# Patient Record
Sex: Female | Born: 1960 | Race: White | Hispanic: No | Marital: Married
Health system: Midwestern US, Community
[De-identification: ages and names within clinical notes are randomized; demographics above are authoritative.]

## PROBLEM LIST (undated history)

## (undated) DIAGNOSIS — H938X1 Other specified disorders of right ear: Secondary | ICD-10-CM

## (undated) DIAGNOSIS — K219 Gastro-esophageal reflux disease without esophagitis: Secondary | ICD-10-CM

## (undated) DIAGNOSIS — R1013 Epigastric pain: Secondary | ICD-10-CM

## (undated) DIAGNOSIS — S82899A Other fracture of unspecified lower leg, initial encounter for closed fracture: Secondary | ICD-10-CM

## (undated) DIAGNOSIS — M722 Plantar fascial fibromatosis: Secondary | ICD-10-CM

## (undated) DIAGNOSIS — K649 Unspecified hemorrhoids: Secondary | ICD-10-CM

## (undated) DIAGNOSIS — M8580 Other specified disorders of bone density and structure, unspecified site: Secondary | ICD-10-CM

## (undated) DIAGNOSIS — R55 Syncope and collapse: Secondary | ICD-10-CM

## (undated) DIAGNOSIS — D126 Benign neoplasm of colon, unspecified: Secondary | ICD-10-CM

## (undated) DIAGNOSIS — R1011 Right upper quadrant pain: Secondary | ICD-10-CM

## (undated) DIAGNOSIS — H04129 Dry eye syndrome of unspecified lacrimal gland: Secondary | ICD-10-CM

## (undated) DIAGNOSIS — T7840XA Allergy, unspecified, initial encounter: Secondary | ICD-10-CM

## (undated) HISTORY — DX: Allergy, unspecified, initial encounter: T78.40XA

## (undated) HISTORY — PX: TUBAL LIGATION: SHX77

## (undated) HISTORY — DX: Epigastric pain: R10.13

## (undated) HISTORY — DX: Other fracture of unspecified lower leg, initial encounter for closed fracture: S82.899A

## (undated) HISTORY — DX: Unspecified hemorrhoids: K64.9

## (undated) HISTORY — DX: Benign neoplasm of colon, unspecified: D12.6

## (undated) HISTORY — DX: Other specified disorders of right ear: H93.8X1

## (undated) HISTORY — DX: Other specified disorders of bone density and structure, unspecified site: M85.80

## (undated) HISTORY — DX: Dry eye syndrome of unspecified lacrimal gland: H04.129

## (undated) HISTORY — PX: POLYPECTOMY: SHX149

## (undated) HISTORY — DX: Plantar fascial fibromatosis: M72.2

## (undated) HISTORY — PX: UPPER GASTROINTESTINAL ENDOSCOPY: SHX188

## (undated) HISTORY — DX: Syncope and collapse: R55

## (undated) HISTORY — PX: COLONOSCOPY: SHX174

## (undated) HISTORY — DX: Gastro-esophageal reflux disease without esophagitis: K21.9

## (undated) HISTORY — DX: Right upper quadrant pain: R10.11

## (undated) HISTORY — PX: DENTAL EXAMINATION UNDER ANESTHESIA: SHX1447

---

## 1998-09-19 ENCOUNTER — Other Ambulatory Visit: Admission: RE | Admit: 1998-09-19 | Discharge: 1998-09-19 | Payer: Self-pay | Admitting: *Deleted

## 1999-10-03 ENCOUNTER — Other Ambulatory Visit: Admission: RE | Admit: 1999-10-03 | Discharge: 1999-10-03 | Payer: Self-pay | Admitting: *Deleted

## 2001-08-13 ENCOUNTER — Encounter: Admission: RE | Admit: 2001-08-13 | Discharge: 2001-08-13 | Payer: Self-pay | Admitting: *Deleted

## 2001-08-13 ENCOUNTER — Encounter: Payer: Self-pay | Admitting: *Deleted

## 2002-03-11 ENCOUNTER — Encounter: Payer: Self-pay | Admitting: *Deleted

## 2002-03-11 ENCOUNTER — Encounter: Admission: RE | Admit: 2002-03-11 | Discharge: 2002-03-11 | Payer: Self-pay | Admitting: *Deleted

## 2002-08-03 ENCOUNTER — Other Ambulatory Visit: Admission: RE | Admit: 2002-08-03 | Discharge: 2002-08-03 | Payer: Self-pay | Admitting: *Deleted

## 2003-07-18 ENCOUNTER — Other Ambulatory Visit: Admission: RE | Admit: 2003-07-18 | Discharge: 2003-07-18 | Payer: Self-pay | Admitting: *Deleted

## 2004-09-04 ENCOUNTER — Other Ambulatory Visit: Admission: RE | Admit: 2004-09-04 | Discharge: 2004-09-04 | Payer: Self-pay | Admitting: Obstetrics and Gynecology

## 2004-12-16 ENCOUNTER — Ambulatory Visit (HOSPITAL_COMMUNITY): Admission: RE | Admit: 2004-12-16 | Discharge: 2004-12-16 | Payer: Self-pay | Admitting: Otolaryngology

## 2005-10-01 ENCOUNTER — Other Ambulatory Visit: Admission: RE | Admit: 2005-10-01 | Discharge: 2005-10-01 | Payer: Self-pay | Admitting: Obstetrics and Gynecology

## 2005-12-01 DIAGNOSIS — D126 Benign neoplasm of colon, unspecified: Secondary | ICD-10-CM

## 2005-12-01 HISTORY — DX: Benign neoplasm of colon, unspecified: D12.6

## 2005-12-05 ENCOUNTER — Ambulatory Visit: Payer: Self-pay | Admitting: Gastroenterology

## 2005-12-17 ENCOUNTER — Ambulatory Visit: Payer: Self-pay | Admitting: Gastroenterology

## 2005-12-17 ENCOUNTER — Encounter (INDEPENDENT_AMBULATORY_CARE_PROVIDER_SITE_OTHER): Payer: Self-pay | Admitting: Specialist

## 2005-12-17 DIAGNOSIS — D126 Benign neoplasm of colon, unspecified: Secondary | ICD-10-CM | POA: Insufficient documentation

## 2005-12-17 DIAGNOSIS — K644 Residual hemorrhoidal skin tags: Secondary | ICD-10-CM | POA: Insufficient documentation

## 2005-12-17 HISTORY — DX: Benign neoplasm of colon, unspecified: D12.6

## 2008-05-01 ENCOUNTER — Ambulatory Visit: Payer: Self-pay | Admitting: Gastroenterology

## 2008-05-01 DIAGNOSIS — R1011 Right upper quadrant pain: Secondary | ICD-10-CM

## 2008-05-01 DIAGNOSIS — K219 Gastro-esophageal reflux disease without esophagitis: Secondary | ICD-10-CM

## 2008-05-01 HISTORY — DX: Gastro-esophageal reflux disease without esophagitis: K21.9

## 2008-05-01 HISTORY — DX: Right upper quadrant pain: R10.11

## 2008-06-20 ENCOUNTER — Ambulatory Visit: Payer: Self-pay | Admitting: Gastroenterology

## 2008-06-22 ENCOUNTER — Encounter: Payer: Self-pay | Admitting: Gastroenterology

## 2008-07-04 ENCOUNTER — Ambulatory Visit: Payer: Self-pay | Admitting: Gastroenterology

## 2008-07-04 ENCOUNTER — Encounter: Payer: Self-pay | Admitting: Gastroenterology

## 2008-07-06 ENCOUNTER — Encounter: Payer: Self-pay | Admitting: Gastroenterology

## 2008-07-10 ENCOUNTER — Telehealth: Payer: Self-pay | Admitting: Gastroenterology

## 2008-08-11 ENCOUNTER — Telehealth: Payer: Self-pay | Admitting: Gastroenterology

## 2009-01-26 ENCOUNTER — Encounter: Payer: Self-pay | Admitting: Gastroenterology

## 2009-12-19 ENCOUNTER — Telehealth: Payer: Self-pay | Admitting: Gastroenterology

## 2010-01-15 ENCOUNTER — Ambulatory Visit: Payer: Self-pay | Admitting: Gastroenterology

## 2010-01-15 DIAGNOSIS — Z860100 Personal history of colon polyps, unspecified: Secondary | ICD-10-CM | POA: Insufficient documentation

## 2010-01-15 DIAGNOSIS — R1013 Epigastric pain: Secondary | ICD-10-CM

## 2010-01-15 HISTORY — DX: Epigastric pain: R10.13

## 2010-01-17 ENCOUNTER — Ambulatory Visit (HOSPITAL_COMMUNITY): Admission: RE | Admit: 2010-01-17 | Discharge: 2010-01-17 | Payer: Self-pay | Admitting: Gastroenterology

## 2010-01-18 ENCOUNTER — Telehealth: Payer: Self-pay | Admitting: Gastroenterology

## 2010-01-18 DIAGNOSIS — R933 Abnormal findings on diagnostic imaging of other parts of digestive tract: Secondary | ICD-10-CM | POA: Insufficient documentation

## 2010-01-22 ENCOUNTER — Encounter: Admission: RE | Admit: 2010-01-22 | Discharge: 2010-01-22 | Payer: Self-pay | Admitting: Gastroenterology

## 2010-03-11 ENCOUNTER — Ambulatory Visit: Payer: Self-pay | Admitting: Gastroenterology

## 2010-04-15 ENCOUNTER — Encounter: Payer: Self-pay | Admitting: Gastroenterology

## 2010-05-06 ENCOUNTER — Encounter: Admission: RE | Admit: 2010-05-06 | Discharge: 2010-08-04 | Payer: Self-pay | Admitting: Otolaryngology

## 2010-05-29 ENCOUNTER — Telehealth: Payer: Self-pay | Admitting: Gastroenterology

## 2010-09-24 ENCOUNTER — Telehealth: Payer: Self-pay | Admitting: Gastroenterology

## 2010-10-01 ENCOUNTER — Telehealth: Payer: Self-pay | Admitting: Gastroenterology

## 2010-12-11 ENCOUNTER — Encounter: Payer: Self-pay | Admitting: Gastroenterology

## 2010-12-20 ENCOUNTER — Encounter
Admission: RE | Admit: 2010-12-20 | Discharge: 2010-12-20 | Payer: Self-pay | Source: Home / Self Care | Attending: Family Medicine | Admitting: Family Medicine

## 2010-12-21 ENCOUNTER — Encounter: Payer: Self-pay | Admitting: Otolaryngology

## 2010-12-31 NOTE — Progress Notes (Signed)
Summary: Throat feels dry  Phone Note Call from Patient Call back at [phone]   Call For: DR Russella Dar Summary of Call: Throat feels really dry and is unsure if she should see GI or ENT? Initial call taken by: Leanor Kail [facility],  September 24, 2010 12:35 PM  Follow-up for Phone Call        Left message for patient to call back Darcey Nora RN, [facility]  September 24, 2010 1:16 PM  Patient c/o dry throat wondering if this is from GERD.  She currently is on Dexilant. She states that her symptoms are better after she eats or drinks.    She is wondering if she should see a GI or ENT for this problem.  Please advise Follow-up by: Darcey Nora RN, CGRN,  September 24, 2010 2:46 PM  Additional Follow-up for Phone Call Additional follow up Details #1::        Dry throat is usually not GERD or GI related. Would see ENT first. Additional Follow-up by: Meryl Dare MD FACG,  September 24, 2010 3:04 PM    Additional Follow-up for Phone Call Additional follow up Details #2::    patient advised of Dr Ardell Isaacs recommendations Follow-up by: Darcey Nora RN, CGRN,  September 24, 2010 3:22 PM

## 2010-12-31 NOTE — Progress Notes (Signed)
Summary: MRI resch  Phone Note Call from Patient Call back at [phone]   Caller: Patient Call For: Dr. Russella Dar Reason for Call: Talk to Nurse Summary of Call: pt wants to know is it is ok that she moved her MRI from Friday (2/25) to Tuesday (2/22) at 7:30AM... pt said that she only needs a call back if this will be a problem Initial call taken by: Vallarie Mare,  January 18, 2010 4:30 PM

## 2010-12-31 NOTE — Assessment & Plan Note (Signed)
Summary: F/U APPT...LSW.   History of Present Illness Visit Type: Follow-up Visit Primary GI MD: Elie Goody MD [facility] Primary Provider: Maurice Small, MD Requesting Provider: n/a Chief Complaint: Patient comes for f/u reflux. She states that she has gotten somewhat better but still experiences some belching and food regurgitation. She denies any abdominal pain or other GI problems at this time. History of Present Illness:   This is a return office visit for GERD with LPR and recurrent epigastric pain. Her epigastric pain has completely resolved. Her reflux symptoms have improved substantially on Dexilant. Her voice fatigue is better, but persists.   GI Review of Systems    Reports acid reflux, belching, and  heartburn.      Denies abdominal pain, bloating, chest pain, dysphagia with liquids, dysphagia with solids, loss of appetite, nausea, vomiting, vomiting blood, weight loss, and  weight gain.      Reports hemorrhoids.     Denies anal fissure, black tarry stools, change in bowel habit, constipation, diarrhea, diverticulosis, fecal incontinence, heme positive stool, irritable bowel syndrome, jaundice, light color stool, liver problems, rectal bleeding, and  rectal pain. Preventive Screening-Counseling & Management  Alcohol-Tobacco     Smoking Status: quit   Current Medications (verified): 1)  Combipatch 0.05-0.14 Mg/day Pttw (Estradiol-Norethindrone Acet) .... Change Patch Twice Weekly 2)  Allegra 180 Mg Tabs (Fexofenadine Hcl) .... Take 1 Tablet By Mouth Once A Day 3)  Align  Caps (Probiotic Product) .... One Capsule By Mouth Once Daily 4)  Dexilant 60 Mg Cpdr (Dexlansoprazole) .... One Tablet By Mouth Once Daily  Allergies (verified): No Known Drug Allergies  Past History:  Past Medical History: HEMORRHOIDS, EXTERNAL (ICD-455.3) ADENOMATOUS COLONIC POLYP (ICD-211.3) GERD, LPR  Past Surgical History: Reviewed history from 04/28/2008 and no changes required. Tubal  Ligation  Family History: Family History of Colon Cancer:Father Family History of Heart Disease: Mother Family History of Colon Polyps: Brother  Social History: Married Alcohol Use - yes rare wine on weekend Illicit Drug Use - no Patient gets regular exercise. Patient is a former smoker. -smoked occasionally in college Smoking Status:  quit  Review of Systems       The pertinent positives and negatives are noted as above and in the HPI. All other ROS were reviewed and were negative.   Vital Signs:  Patient profile:   50 year old female Height:      67 inches Weight:      132.50 pounds BMI:     20.83 BSA:     1.70 Pulse rate:   68 / minute Pulse rhythm:   regular BP sitting:   104 / 62  (right arm)  Vitals Entered By: Hortense Ramal CMA Duncan Dull) (March 11, 2010 8:46 AM)  Physical Exam  General:  Well developed, well nourished, no acute distress. Head:  Normocephalic and atraumatic. Eyes:  PERRLA, no icterus. Ears:  Normal auditory acuity. Mouth:  No deformity or lesions, dentition normal. Lungs:  Clear throughout to auscultation. Heart:  Regular rate and rhythm; no murmurs, rubs,  or bruits. Abdomen:  Soft, nontender and nondistended. No masses, hepatosplenomegaly or hernias noted. Normal bowel sounds. Psych:  Alert and cooperative. Normal mood and affect.  Impression & Recommendations:  Problem # 1:  GERD (ICD-530.81) Assessment Improved GERD with LPR improved. Intensify antireflux measures. Continue current medication. If her vocal symptoms do not improve, return to ENT for further evaluation.  Problem # 2:  ABDOMINAL PAIN-EPIGASTRIC (ICD-789.06) Assessment: Improved Resolved.  Problem # 3:  GASTROINTESTINAL XRAY, ABNORMAL (ICD-793.4) Small hepatic hemangiomas on MR imaging. No further followup needed.  Problem # 4:  PERSONAL HX COLONIC POLYPS (ICD-V12.72) Personal history of adenomatous colon polyps. Recall colonoscopy to January 2012  Problem # 5:   COLORECTAL CANCER, FAMILY HX (ICD-V16.0) Father with colon cancer. See problem number 4.  Patient Instructions: 1)  Avoid foods high in acid content ( tomatoes, citrus juices, spicy foods) . Avoid eating within 3 to 4 hours of lying down or before exercising. Do not over eat; try smaller more frequent meals. Elevate head of bed four inches when sleeping.  2)  Please schedule a follow-up appointment in 6 months. 3)  Copy sent to : Maurice Small, MD 4)  The medication list was reviewed and reconciled.  All changed / newly prescribed medications were explained.  A complete medication list was provided to the patient / caregiver.

## 2010-12-31 NOTE — Progress Notes (Signed)
Summary: Triage  Phone Note Call from Patient Call back at [phone]   Caller: Patient Call For: Dr. Russella Dar Reason for Call: Talk to Nurse Summary of Call: pt. stopped the dexilant b/c it was causing her throat to be dry....could he recommend another med. Initial call taken by: Karna Christmas,  October 01, 2010 12:43 PM  Follow-up for Phone Call        Left message for patient to call back Darcey Nora RN, [facility]  October 01, 2010 1:37 PM  patient wanted Dr Russella Dar to be aware she stopped the Dexilant last week and she is not having any further dry throat.  She is wondering if she needs to try an alternate PPI. Darcey Nora RN, [facility]  October 01, 2010 1:49 PM  Additional Follow-up for Phone Call Additional follow up Details #1::        Try omeprazole 40mg  by mouth qam Additional Follow-up by: Meryl Dare MD Nani Skillern 2:05 PM    Additional Follow-up for Phone Call Additional follow up Details #2::    patient advised of Dr Ardell Isaacs recommendations Darcey Nora RN, [facility]  October 01, 2010 2:32 PM Follow-up by: Darcey Nora RN, CGRN,  October 01, 2010 2:32 PM  New/Updated Medications: OMEPRAZOLE 40 MG CPDR (OMEPRAZOLE) 1 by mouth once daily Prescriptions: OMEPRAZOLE 40 MG CPDR (OMEPRAZOLE) 1 by mouth once daily  #30 x 11   Entered by:   Darcey Nora RN, CGRN   Authorized by:   Meryl Dare MD [facility]   Signed by:   Darcey Nora RN, CGRN on 10/01/2010   Method used:   Electronically to        [facility]* (retail)       [address]       [place], [place]  [id number]       Ph: [phone] or [phone]       Fax: [phone]   RxID:   [id number]

## 2010-12-31 NOTE — Progress Notes (Signed)
Summary: triage  Phone Note Call from Patient Call back at [phone]   Caller: Patient Call For: Russella Dar Reason for Call: Talk to Nurse Summary of Call: Patient states that she has muscle spasm and reflux wants to know what to do Initial call taken by: Tawni Levy,  December 19, 2009 2:03 PM  Follow-up for Phone Call        Left message for patient to call back Darcey Nora RN, [facility]  December 19, 2009 3:0   Patient  c/o epigastric discomfort and reflux she had stopped her PPI.  I have advied ehr to restart on zegerid.  She reports she did purchase some Priolosec and started on it yesterday.  Patient will try 2 otc prevacid a day and call back for rev if no improvement after 2 weeks. Follow-up by: Darcey Nora RN, CGRN,  December 19, 2009 3:27 PM

## 2010-12-31 NOTE — Progress Notes (Signed)
Summary: Reduce Dexilant  Phone Note Call from Patient Call back at [phone]   Call For: Dr Russella Dar Reason for Call: Talk to Doctor Summary of Call: Wants to reduce reflux medicine Dexilant or maybe switch to something over the counter. Initial call taken by: Leanor Kail [facility],  May 29, 2010 1:03 PM  Follow-up for Phone Call        Called pt and phone rang with no voicemial to leave a message.  Follow-up by: Christie Nottingham CMA Duncan Dull),  May 29, 2010 1:55 PM  Additional Follow-up for Phone Call Additional follow up Details #1::        left message for pt  to call back  Additional Follow-up by: Christie Nottingham CMA Duncan Dull),  May 30, 2010 3:05 PM    Additional Follow-up for Phone Call Additional follow up Details #2::    Pt states her ENT physician told her after her laryngoscopy that there was no reflux shown and that they do not understand why she is on medicine for reflux. I asked pt is she having reflux symptoms while taking Dexilant one tablet by mouth once daily and pt states her symptoms have greatly improved. She wants to reduce the milligram or switch to a PPI OTC b/c she feels like long term use of a PPI will hurt her bones. She states she just wants to try to take something less expensive and ideally not be on any medicine for reflux. I told her to try to take atleast a PPI OTC once a day and to intensify anti-reflux measures. Told pt that we do not recommend her just stopping a PPI b/c she had reflux symptoms still when she was seen last in the office. I told her try Prevacid OTC one tablet by mouth once daily x 2 weeks. If symptoms are not under control then increase to two times a day x 2 weeks. During the whole time staying on anti-reflux measeures. After that if her symptoms are not resolved we recommend to start back on Dexilant one tablet by mouth due to her symptoms being well controlled on this medication  Pt agreed and verblaized understanding.  Pt aware that note will be sent  to doctor to make that this ok with him.  Follow-up by: Christie Nottingham CMA Duncan Dull),  May 31, 2010 1:03 PM  Additional Follow-up for Phone Call Additional follow up Details #3:: Details for Additional Follow-up Action Taken: Agree with above Additional Follow-up by: Meryl Dare MD Clementeen Graham,  June 03, 2010 3:50 PM

## 2010-12-31 NOTE — Letter (Signed)
Summary: [facility]  [facility]   Imported By: Lester [place] 04/26/2010 08:24:58  _____________________________________________________________________  External Attachment:    Type:   Image     Comment:   External Document

## 2010-12-31 NOTE — Assessment & Plan Note (Signed)
Summary: stomach spasms--ch.   History of Present Illness Visit Type: Follow-up Visit Primary GI MD: Elie Goody MD [facility] Primary Provider: Maurice Small, MD Requesting Provider: n/a Chief Complaint: stomach spasm, also reflux and belching esp after eating "rich" food History of Present Illness:   Virginia Liu relates worsening problems with acid reflux and belching for the past 2 months. She has been off all acid medications for several months due to concerns about these medications causing a low vitamin D level. She relates voice fatigue as well.  She also relates intermittent problems with gas, bloating, and "upper abdominal spasms" that may occur several times a day in the last for several minutes.   GI Review of Systems    Reports abdominal pain, acid reflux, and  belching.     Location of  Abdominal pain: upper abdomen.    Denies bloating, chest pain, dysphagia with liquids, dysphagia with solids, heartburn, loss of appetite, nausea, vomiting, vomiting blood, weight loss, and  weight gain.        Denies anal fissure, black tarry stools, change in bowel habit, constipation, diarrhea, diverticulosis, fecal incontinence, heme positive stool, hemorrhoids, irritable bowel syndrome, jaundice, light color stool, liver problems, rectal bleeding, and  rectal pain. Current Medications (verified): 1)  Combipatch 0.05-0.14 Mg/day Pttw (Estradiol-Norethindrone Acet) .... Change Patch Twice Weekly 2)  Allegra 180 Mg Tabs (Fexofenadine Hcl) .... Take 1 Tablet By Mouth Once A Day  Allergies (verified): No Known Drug Allergies  Past History:  Past Medical History: Reviewed history from 05/01/2008 and no changes required. HEMORRHOIDS, EXTERNAL (ICD-455.3) ADENOMATOUS COLONIC POLYP (ICD-211.3)  Past Surgical History: Reviewed history from 04/28/2008 and no changes required. Tubal Ligation  Family History: Family History of Colon Cancer:Father Family History of Heart Disease:  Mother  Social History: Reviewed history from 05/01/2008 and no changes required. Married Patient has never smoked.  Alcohol Use - yes rare wine on weekend Illicit Drug Use - no Patient gets regular exercise.  Review of Systems       The pertinent positives and negatives are noted as above and in the HPI. All other ROS were reviewed and were negative.   Vital Signs:  Patient profile:   50 year old female Height:      67 inches Weight:      131 pounds BMI:     20.59 Pulse rate:   72 / minute Pulse rhythm:   regular BP sitting:   100 / 60  (left arm) Cuff size:   regular  Vitals Entered By: Francee Piccolo CMA Duncan Dull) (January 15, 2010 8:44 AM)  Physical Exam  General:  Well developed, well nourished, no acute distress. Head:  Normocephalic and atraumatic. Eyes:  PERRLA, no icterus. Mouth:  No deformity or lesions, dentition normal. Lungs:  Clear throughout to auscultation. Heart:  Regular rate and rhythm; no murmurs, rubs,  or bruits. Abdomen:  Soft, nontender and nondistended. No masses, hepatosplenomegaly or hernias noted. Normal bowel sounds. Psych:  Alert and cooperative. Normal mood and affect.  Impression & Recommendations:  Problem # 1:  GERD (ICD-530.81)  Recurrent reflux with LPR symptoms. Begin Dexilant 60 mg q.a.m. Continue all standard antireflux measures. She is advised that optimal management for this problem would include remaining on a proton pump inhibitor long term. She is advised to have bone density studies on a regular basis by her PCP.  Orders: Ultrasound Abdomen (UAS)  Problem # 2:  ABDOMINAL PAIN-EPIGASTRIC (ICD-789.06)  Epigastric "spasms"  associated with gas and bloating. Begin  a low-fat gas diet and Align daily. If her symptoms don't respond, consider a trial of anti-spasmodic. Schedule abdominal ultrasound rule out cholelithiasis.  Orders: Ultrasound Abdomen (UAS)  Problem # 3:  COLORECTAL CANCER, FAMILY HX (ICD-V16.0) Colonoscopy  January 2012.  Problem # 4:  PERSONAL HX COLONIC POLYPS (ICD-V12.72) As in problem #3.  Patient Instructions: 1)  You have been scheduled for a abdominal ultrasound.  2)  Start Align one tablet by mouth once daily. 3)  Pick up prescription at your pharmacy.  4)  Excessive Gas Diet handout given.  5)  Please schedule a follow-up appointment in 2 months.  6)  Copy sent to : Maurice Small, MD 7)  The medication list was reviewed and reconciled.  All changed / newly prescribed medications were explained.  A complete medication list was provided to the patient / caregiver.  Prescriptions: DEXILANT 60 MG CPDR (DEXLANSOPRAZOLE) one tablet by mouth once daily  #30 x 11   Entered by:   Christie Nottingham CMA (AAMA)   Authorized by:   Meryl Dare MD [facility]   Signed by:   Meryl Dare MD [facility] on 01/15/2010   Method used:   Electronically to        [facility] (retail)       [address]       [place], [place]  [id number]       Ph: [phone] or [phone]       Fax: [phone]   RxID:   [id number]

## 2011-01-02 NOTE — Letter (Signed)
Summary: Colonoscopy Letter  [facility]  [address] [place], [place] [address]   Phone: [phone]  Fax: [phone]      December 11, 2010 MRN: [id number]   Virginia Liu [address] [place], [place]  [address]   Dear Ms. Virginia Liu,   According to your medical record, it is time for you to schedule a Colonoscopy. The American Cancer Society recommends this procedure as a method to detect early colon cancer. Patients with a family history of colon cancer, or a personal history of colon polyps or inflammatory bowel disease are at increased risk.  This letter has been generated based on the recommendations made at the time of your procedure. If you feel that in your particular situation this may no longer apply, please contact our office.  Please call our office at [phone] to schedule this appointment or to update your records at your earliest convenience.  Thank you for cooperating with [place] to provide you with the very best care possible.   Sincerely,  Judie Petit T. Russella Dar, M.D.  [facility] [phone]

## 2011-01-04 ENCOUNTER — Encounter: Payer: Self-pay | Admitting: Family Medicine

## 2011-05-30 ENCOUNTER — Telehealth: Payer: Self-pay | Admitting: Gastroenterology

## 2011-05-30 ENCOUNTER — Ambulatory Visit (AMBULATORY_SURGERY_CENTER): Payer: BC Managed Care – PPO | Admitting: *Deleted

## 2011-05-30 VITALS — Ht 67.5 in | Wt 128.0 lb

## 2011-05-30 DIAGNOSIS — Z86010 Personal history of colonic polyps: Secondary | ICD-10-CM

## 2011-05-30 MED ORDER — PEG-KCL-NACL-NASULF-NA ASC-C 100 G PO SOLR: ORAL | Status: DC

## 2011-05-30 NOTE — Telephone Encounter (Signed)
Asked patient if she switched back to Dexilant after we sent a prescription for omeprazole back in November of 2011. She states she picked up the prescription but never tried to take it and continued back on the Dexilant after several months of not taking anything. Pt states she might still have that prescription for omeprazole and would try to take that instead of the Dexilant but if she does not have it, she will call back to get a refill. I also told her she is due for a office visit and patient states she is scheduled to have a Colonoscopy in July. Told patient to just call back if she needs a refill.

## 2011-06-02 ENCOUNTER — Other Ambulatory Visit: Payer: Self-pay | Admitting: Gastroenterology

## 2011-06-10 ENCOUNTER — Encounter: Payer: Self-pay | Admitting: Gastroenterology

## 2011-06-10 ENCOUNTER — Ambulatory Visit (AMBULATORY_SURGERY_CENTER): Payer: BC Managed Care – PPO | Admitting: Gastroenterology

## 2011-06-10 DIAGNOSIS — D126 Benign neoplasm of colon, unspecified: Secondary | ICD-10-CM

## 2011-06-10 DIAGNOSIS — Z86010 Personal history of colonic polyps: Secondary | ICD-10-CM

## 2011-06-10 DIAGNOSIS — Z1211 Encounter for screening for malignant neoplasm of colon: Secondary | ICD-10-CM

## 2011-06-10 DIAGNOSIS — Z800 Family history of malignant neoplasm of digestive organs: Secondary | ICD-10-CM

## 2011-06-10 MED ORDER — SODIUM CHLORIDE 0.9 % IV SOLN: 500.0000 mL | INTRAVENOUS | Status: DC

## 2011-06-10 NOTE — Patient Instructions (Signed)
Discharge instructions given with verbal understanding. Handouts on polyps given. Resume previous medications. 

## 2011-06-11 ENCOUNTER — Telehealth: Payer: Self-pay

## 2011-06-11 NOTE — Telephone Encounter (Signed)
No ID on answering machine. 

## 2011-06-17 ENCOUNTER — Encounter: Payer: Self-pay | Admitting: Gastroenterology

## 2011-08-20 ENCOUNTER — Other Ambulatory Visit: Payer: Self-pay | Admitting: Gastroenterology

## 2011-12-24 ENCOUNTER — Ambulatory Visit (INDEPENDENT_AMBULATORY_CARE_PROVIDER_SITE_OTHER): Payer: BC Managed Care – PPO | Admitting: Sports Medicine

## 2011-12-24 VITALS — BP 120/70 | Ht 67.0 in | Wt 130.0 lb

## 2011-12-24 DIAGNOSIS — M722 Plantar fascial fibromatosis: Secondary | ICD-10-CM | POA: Insufficient documentation

## 2011-12-24 HISTORY — DX: Plantar fascial fibromatosis: M72.2

## 2011-12-24 MED ORDER — MELOXICAM 15 MG PO TABS: 15.0000 mg | ORAL_TABLET | Freq: Every day | ORAL | Status: DC | PRN

## 2011-12-24 NOTE — Progress Notes (Signed)
  Subjective:    Patient ID: Virginia Liu, female    DOB: 1961/11/05, 51 y.o.   MRN: [id number]  HPI  Pt presents to clinic for evaluation of lt heel pain that started 04/2011.  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 Wears soft orthotics made by [employer], but no longer helping heel pain. Pain worse in mornings and evenings.  Toes very sensitive at night, painful for bed sheets to touch toes.  Rides stationary bike, elliptical, stairmaster, body pump class. Enjoys jogging,but unable to do this now 2/2 heel pain.   Review of Systems     Objective:   Physical Exam  Lt long arch preserved Rt long arch mild pronation Mild splaying between 3rd and 4th toes bilat Drop of 3rd and 4th MT heads on lt Drop of 4th MT head, partially dropped 3rd MT head with abnormal callus Excellent great toe motion Nml AT bilat  [place] Left PF is 0.81 thick There is a small tear at the medial insertion with hypoechoic change  RT PF shows slt thickening at 0.53 There is a large spur       Assessment & Plan:

## 2011-12-24 NOTE — Patient Instructions (Signed)
Please try your orthotics with felt heel lifts, and wear the pair that is most comfortable  Do suggested exercises and stretches for plantar fasciits daily  Please do ice baths on left heel daily   Take meloxicam 15 mg - 1 tab daily for 2 weeks, then as needed   Please follow up in 1 month  Thank you for seeing [place] today!

## 2011-12-24 NOTE — Assessment & Plan Note (Signed)
Begin series of stretches and exercises  Icing  Has good orthotics so we added felt lifts of 2 thicknesses She can use the one feels best  Arch strap  Reck 1 mo

## 2012-01-21 ENCOUNTER — Ambulatory Visit: Payer: BC Managed Care – PPO | Admitting: Sports Medicine

## 2012-01-28 ENCOUNTER — Ambulatory Visit (INDEPENDENT_AMBULATORY_CARE_PROVIDER_SITE_OTHER): Payer: BC Managed Care – PPO | Admitting: Sports Medicine

## 2012-01-28 VITALS — BP 124/70

## 2012-01-28 DIAGNOSIS — M722 Plantar fascial fibromatosis: Secondary | ICD-10-CM

## 2012-01-28 NOTE — Assessment & Plan Note (Signed)
Chronic and very painful  Advised that we need to try another orthotic that is semi rigid  Patient was fitted for a : standard, cushioned, semi-rigid orthotic. The orthotic was heated and afterward the patient stood on the orthotic blank positioned on the orthotic stand. The patient was positioned in subtalar neutral position and 10 degrees of ankle dorsiflexion in a weight bearing stance. After completion of molding, a stable base was applied to the orthotic blank. The blank was ground to a stable position for weight bearing. Size: 7 red cambray Base: blue EVA Posting: foam rubber heel pads bilat Additional orthotic padding:  Feels these are too firm in arch However walking gait does look neutral Suggest trying to break these in with alternating with the softer old orthotic  Cont arch strap, exercises, stretches and icing  Reck after 2 months  I think her frustration level and difficulty with dealing with this pain (does not want to use meds) are challenges for getting her treatment to work  If simply not better - consider CSI, MRI for further DX or lithotripsy

## 2012-01-28 NOTE — Progress Notes (Signed)
  Subjective:    Patient ID: Virginia Liu, female    DOB: 1961-10-05, 51 y.o.   MRN: [id number]  HPI  Patient returns for f/u of left PF This was 0.8 thick on [place] scan since being seen says she is really no better We tried arch strap Heel pad on older soft orthotics that have cork base Exercises and stretches Ices once daily She says she is fairly consistent with exercises  Does not want to take medications Says Mobic makes her sleepy "other meds are not well tolerated" Ok with naproxen but rarely uses  She is very frustrated Feels that progress and pain is even slower than last time she had this When she had this for RT foot it took her several years to get rid of pain  Review of Systems     Objective:   Physical Exam NAD but appears depressed and is tearful after we made orthotics and she still had pain  Pain just over medial insertion of PF Moderate arch bilat Gait appears normal No abnormal swelling  Calcaneal squeeze is not painful       Assessment & Plan:

## 2012-02-23 ENCOUNTER — Other Ambulatory Visit: Payer: Self-pay | Admitting: Obstetrics and Gynecology

## 2012-11-01 ENCOUNTER — Telehealth: Payer: Self-pay | Admitting: Gastroenterology

## 2012-11-01 NOTE — Telephone Encounter (Signed)
Patient hasn't been in the office since 2011.  She will come in and see Dr Russella Dar on 11/03/12 11:15

## 2012-11-01 NOTE — Telephone Encounter (Signed)
Left message for patient to call back  

## 2012-11-03 ENCOUNTER — Ambulatory Visit (INDEPENDENT_AMBULATORY_CARE_PROVIDER_SITE_OTHER): Payer: BC Managed Care – PPO | Admitting: Gastroenterology

## 2012-11-03 ENCOUNTER — Encounter: Payer: Self-pay | Admitting: Gastroenterology

## 2012-11-03 VITALS — BP 100/64 | HR 84 | Ht 67.0 in | Wt 128.5 lb

## 2012-11-03 DIAGNOSIS — K219 Gastro-esophageal reflux disease without esophagitis: Secondary | ICD-10-CM

## 2012-11-03 DIAGNOSIS — Z86010 Personal history of colonic polyps: Secondary | ICD-10-CM

## 2012-11-03 DIAGNOSIS — Z800 Family history of malignant neoplasm of digestive organs: Secondary | ICD-10-CM

## 2012-11-03 MED ORDER — DEXLANSOPRAZOLE 60 MG PO CPDR: 60.0000 mg | DELAYED_RELEASE_CAPSULE | Freq: Every day | ORAL | Status: DC

## 2012-11-03 NOTE — Progress Notes (Signed)
History of Present Illness: This is a 51 year old female with a history of GERD. She previously required Dexilant to control her symptoms. She's tried to wean herself off medications and after coming off medication she notes frequent problems with heartburn and regurgitation with exercise at night time and occasionally following meals. She describes episodes of "vomiting in her mouth". Recently she tried taking Prevacid over-the-counter with only slight improvement in symptoms. She notes occasional constipation that is under better control when she takes a probiotic on a regular basis. She previously underwent upper endoscopy in 2007 for GERD and was unremarkable. She underwent colonoscopy last year for followup of adenomatous colon polyps and a family history of colon cancer. She occasionally has intestinal gas frequently she notes in the right upper quadrant. Denies weight loss, abdominal pain, diarrhea, change in stool caliber, melena, hematochezia, nausea, vomiting, dysphagia, chest pain.  Current Medications, Allergies, Past Medical History, Past Surgical History, Family History and Social History were reviewed in [employer] record.  Physical Exam: General: Well developed , well nourished, no acute distress Head: Normocephalic and atraumatic Eyes:  sclerae anicteric, EOMI Ears: Normal auditory acuity Mouth: No deformity or lesions Lungs: Clear throughout to auscultation Heart: Regular rate and rhythm; no murmurs, rubs or bruits Abdomen: Soft, non tender and non distended. No masses, hepatosplenomegaly or hernias noted. Normal Bowel sounds Musculoskeletal: Symmetrical with no gross deformities  Pulses:  Normal pulses noted Extremities: No clubbing, cyanosis, edema or deformities noted Neurological: Alert oriented x 4, grossly nonfocal Psychological:  Alert and cooperative. Normal mood and affect  Assessment and Recommendations:  1. GERD with frequent regurgitation.  She closely follows all standard antireflux measures. Discontinue Prevacid OTC and begin Dexilant 60 mg daily. Return office visit in 3 months. If her symptoms are not adequately controlled consider the addition of an H2 blocker at night and consider repeating her upper endoscopy.  2. Personal history of adenomatous colon polyps, family history of colon cancer. Surveillance colonoscopy in 5 years in July 2017.  3. Mild constipation and gas. Continue high fiber diet with adequate water intake along with probiotic. Gas-X 4 times a day when necessary.

## 2012-11-03 NOTE — Patient Instructions (Addendum)
We have sent the following medications to your pharmacy for you to pick up at your convenience: Dexilant to take one tablet by mouth once daily.

## 2013-04-19 ENCOUNTER — Other Ambulatory Visit: Payer: Self-pay | Admitting: Obstetrics and Gynecology

## 2013-07-15 ENCOUNTER — Telehealth: Payer: Self-pay | Admitting: Gastroenterology

## 2013-07-15 NOTE — Telephone Encounter (Signed)
Left message for patient to call back  

## 2013-07-18 NOTE — Telephone Encounter (Signed)
Patient having what she describes as "esophageal spasms".  She has been off of her Dexilant and is only able to tolerate a bland diet.  She restarted her Dexilant late last week , but hasn't seen any improvement.  She is offered an appt for today or this week with an extender.  She has a high deductible plan and wants to wait a while longer for an appt.  She is scheduled to see Dr. Russella Dar on 08/08/13 9:15, she will call me back if she changes her mind and wants to see a PA prior

## 2013-07-18 NOTE — Telephone Encounter (Signed)
Left message for patient to call back  

## 2013-08-08 ENCOUNTER — Ambulatory Visit (INDEPENDENT_AMBULATORY_CARE_PROVIDER_SITE_OTHER): Payer: BC Managed Care – PPO | Admitting: Gastroenterology

## 2013-08-08 ENCOUNTER — Encounter: Payer: Self-pay | Admitting: Gastroenterology

## 2013-08-08 ENCOUNTER — Other Ambulatory Visit: Payer: BC Managed Care – PPO

## 2013-08-08 VITALS — BP 104/70 | HR 60 | Ht 67.0 in | Wt 128.8 lb

## 2013-08-08 DIAGNOSIS — K219 Gastro-esophageal reflux disease without esophagitis: Secondary | ICD-10-CM

## 2013-08-08 DIAGNOSIS — R109 Unspecified abdominal pain: Secondary | ICD-10-CM

## 2013-08-08 DIAGNOSIS — K5900 Constipation, unspecified: Secondary | ICD-10-CM

## 2013-08-08 MED ORDER — HYOSCYAMINE SULFATE 0.125 MG SL SUBL: SUBLINGUAL_TABLET | SUBLINGUAL | Status: DC

## 2013-08-08 MED ORDER — OMEPRAZOLE 40 MG PO CPDR: 40.0000 mg | DELAYED_RELEASE_CAPSULE | Freq: Every day | ORAL | Status: DC

## 2013-08-08 NOTE — Progress Notes (Signed)
History of Present Illness: This is a 52 year old female who has ongoing problems with constipation associated with intermittent crampy abdominal pain in several locations including the epigastrium and lower abdomen. She occasionally has crampy epigastric pain related to stress. When she is off PPI therapy for a few months she tends to have recurrent problems with reflux. Denies weight loss,  diarrhea, change in stool caliber, melena, hematochezia, nausea, vomiting, dysphagia, chest pain.  Current Medications, Allergies, Past Medical History, Past Surgical History, Family History and Social History were reviewed in [employer] record.  Physical Exam: General: Well developed , well nourished, no acute distress Head: Normocephalic and atraumatic Eyes:  sclerae anicteric, EOMI Ears: Normal auditory acuity Mouth: No deformity or lesions Lungs: Clear throughout to auscultation Heart: Regular rate and rhythm; no murmurs, rubs or bruits Abdomen: Soft, mild diffuse tenderness to deep palpation without rebound or guarding and non distended. No masses, hepatosplenomegaly or hernias noted. Normal Bowel sounds Musculoskeletal: Symmetrical with no gross deformities  Pulses:  Normal pulses noted Extremities: No clubbing, cyanosis, edema or deformities noted Neurological: Alert oriented x 4, grossly nonfocal Psychological:  Alert and cooperative. Normal mood and affect  Assessment and Recommendations:  1. GERD. Begin omeprazole 40 mg daily and standard antireflux measures.  2. Constipation, possible IBS. Celiac panel. Miralax qd or qod titrated for adequate response. Hyoscyamine as needed.  3. Personal history of adenomatous colon polyps, family history of colon cancer. Surveillance colonoscopy due July 2017.  4. Benign hepatic hemangiomas on prior MRI.

## 2013-08-08 NOTE — Patient Instructions (Addendum)
Your physician has requested that you go to the basement for the following lab work before leaving today: Celiac panel.   Take over the counter Miralax mixing 17 grams in 8 oz of water every other day to once daily.   We have sent the following medications to your pharmacy for you to pick up at your convenience: Omeprazole 40 mg. If the prescription is too expensive you can also take Prilosec over the counter 20 mg 2 tablets once daily. We also sent into your pharmacy Levsin.   Patient advised to avoid spicy, acidic, citrus, chocolate, mints, fruit and fruit juices.  Limit the intake of caffeine, alcohol and Soda.  Don't exercise too soon after eating.  Don't lie down within 3-4 hours of eating.  Elevate the head of your bed.'  High-Fiber Diet Fiber is found in fruits, vegetables, and grains. A high-fiber diet encourages the addition of more whole grains, legumes, fruits, and vegetables in your diet. The recommended amount of fiber for adult males is 38 g per day. For adult females, it is 25 g per day. Pregnant and lactating women should get 28 g of fiber per day. If you have a digestive or bowel problem, ask your caregiver for advice before adding high-fiber foods to your diet. Eat a variety of high-fiber foods instead of only a select few type of foods.  PURPOSE  To increase stool bulk.  To make bowel movements more regular to prevent constipation.  To lower cholesterol.  To prevent overeating. WHEN IS THIS DIET USED?  It may be used if you have constipation and hemorrhoids.  It may be used if you have uncomplicated diverticulosis (intestine condition) and irritable bowel syndrome.  It may be used if you need help with weight management.  It may be used if you want to add it to your diet as a protective measure against atherosclerosis, diabetes, and cancer. SOURCES OF FIBER  Whole-grain breads and cereals.  Fruits, such as apples, oranges, bananas, berries, prunes, and  pears.  Vegetables, such as green peas, carrots, sweet potatoes, beets, broccoli, cabbage, spinach, and artichokes.  Legumes, such split peas, soy, lentils.  Almonds. FIBER CONTENT IN FOODS Starches and Grains / Dietary Fiber (g)  Cheerios, 1 cup / 3 g  Corn Flakes cereal, 1 cup / 0.7 g  Rice crispy treat cereal, 1 cup / 0.3 g  Instant oatmeal (cooked),  cup / 2 g  Frosted wheat cereal, 1 cup / 5.1 g  Brown, long-grain rice (cooked), 1 cup / 3.5 g  White, long-grain rice (cooked), 1 cup / 0.6 g  Enriched macaroni (cooked), 1 cup / 2.5 g Legumes / Dietary Fiber (g)  Baked beans (canned, plain, or vegetarian),  cup / 5.2 g  Kidney beans (canned),  cup / 6.8 g  Pinto beans (cooked),  cup / 5.5 g Breads and Crackers / Dietary Fiber (g)  Plain or honey graham crackers, 2 squares / 0.7 g  Saltine crackers, 3 squares / 0.3 g  Plain, salted pretzels, 10 pieces / 1.8 g  Whole-wheat bread, 1 slice / 1.9 g  White bread, 1 slice / 0.7 g  Raisin bread, 1 slice / 1.2 g  Plain bagel, 3 oz / 2 g  Flour tortilla, 1 oz / 0.9 g  Corn tortilla, 1 small / 1.5 g  Hamburger or hotdog bun, 1 small / 0.9 g Fruits / Dietary Fiber (g)  Apple with skin, 1 medium / 4.4 g  Sweetened applesauce,  cup / 1.5 g  Banana,  medium / 1.5 g  Grapes, 10 grapes / 0.4 g  Orange, 1 small / 2.3 g  Raisin, 1.5 oz / 1.6 g  Melon, 1 cup / 1.4 g Vegetables / Dietary Fiber (g)  Green beans (canned),  cup / 1.3 g  Carrots (cooked),  cup / 2.3 g  Broccoli (cooked),  cup / 2.8 g  Peas (cooked),  cup / 4.4 g  Mashed potatoes,  cup / 1.6 g  Lettuce, 1 cup / 0.5 g  Corn (canned),  cup / 1.6 g  Tomato,  cup / 1.1 g Document Released: 11/17/2005 Document Revised: 05/18/2012 Document Reviewed: 02/19/2012 [facility] Patient Information 2014 [place], [place].

## 2013-08-09 LAB — CELIAC PANEL 10
Endomysial Screen: NEGATIVE
IgA: 229 mg/dL (ref 69–380)
Tissue Transglutaminase Ab, IgA: 3.3 U/mL (ref <20)

## 2013-09-14 ENCOUNTER — Other Ambulatory Visit: Payer: Self-pay | Admitting: Dermatology

## 2013-11-21 ENCOUNTER — Other Ambulatory Visit: Payer: Self-pay | Admitting: Gastroenterology

## 2014-05-01 ENCOUNTER — Encounter: Payer: Self-pay | Admitting: Gastroenterology

## 2014-05-01 ENCOUNTER — Ambulatory Visit (INDEPENDENT_AMBULATORY_CARE_PROVIDER_SITE_OTHER): Payer: BC Managed Care – PPO | Admitting: Gastroenterology

## 2014-05-01 VITALS — BP 108/64 | HR 70 | Ht 67.0 in | Wt 129.8 lb

## 2014-05-01 DIAGNOSIS — K5900 Constipation, unspecified: Secondary | ICD-10-CM

## 2014-05-01 DIAGNOSIS — K219 Gastro-esophageal reflux disease without esophagitis: Secondary | ICD-10-CM

## 2014-05-01 DIAGNOSIS — R109 Unspecified abdominal pain: Secondary | ICD-10-CM

## 2014-05-01 MED ORDER — DEXLANSOPRAZOLE 60 MG PO CPDR: DELAYED_RELEASE_CAPSULE | ORAL | Status: DC

## 2014-05-01 NOTE — Patient Instructions (Addendum)
Start over the counter Miralax mixing 17 grams in 8 oz of water 1-2 x daily for constipation.  We have sent the following medications to your pharmacy for you to pick up at your convenience: [place].  You will be due for a recall colonoscopy in 05/2016. We will send you a reminder in the mail when it gets closer to that time.   Thank you for choosing me and [facility].  Pricilla Riffle. Dagoberto Ligas., MD., Marval Regal

## 2014-05-01 NOTE — Progress Notes (Signed)
    History of Present Illness: This is a 53 year old female with constipation and bloating. She feels stool softeners were not helpful. She has occasional lower abdominal cramping associated with constipation. Dexlansoprazole has been very effective for management of GERD however other medications have not. Denies weight loss, diarrhea, change in stool caliber, melena, hematochezia, nausea, vomiting, dysphagia, chest pain.  Current Medications, Allergies, Past Medical History, Past Surgical History, Family History and Social History were reviewed in [employer] record.  Physical Exam: General: Well developed , well nourished, no acute distress Head: Normocephalic and atraumatic Eyes:  sclerae anicteric, EOMI Ears: Normal auditory acuity Mouth: No deformity or lesions Lungs: Clear throughout to auscultation Heart: Regular rate and rhythm; no murmurs, rubs or bruits Abdomen: Soft, non tender and non distended. No masses, hepatosplenomegaly or hernias noted. Normal Bowel sounds Musculoskeletal: Symmetrical with no gross deformities  Pulses:  Normal pulses noted Extremities: No clubbing, cyanosis, edema or deformities noted Neurological: Alert oriented x 4, grossly nonfocal Psychological:  Alert and cooperative. Normal mood and affect  Assessment and Recommendations:  1. Chronic constipation, mild. Begin MiraLax once or twice daily titrated for adequate bowel movements and relief of bloating associated with constipation. Encouraged the use of hyoscyamine as needed for crampy abdominal pain.  2. GERD. Continue lansoprazole 60 mg daily and standard antireflux measures.  3. Personal history of adenomatous colon polyps, family history of colon cancer. Surveillance colonoscopy recommended July 2017.

## 2014-06-07 ENCOUNTER — Other Ambulatory Visit: Payer: Self-pay | Admitting: Obstetrics and Gynecology

## 2014-06-09 LAB — CYTOLOGY - PAP

## 2014-09-14 ENCOUNTER — Telehealth: Payer: Self-pay | Admitting: Gastroenterology

## 2014-09-14 NOTE — Telephone Encounter (Signed)
Patient with rectal bleeding intermittently she will come in and see Dr. Fuller Plan on 09/20/14 9:45

## 2014-09-20 ENCOUNTER — Encounter: Payer: Self-pay | Admitting: Gastroenterology

## 2014-09-20 ENCOUNTER — Ambulatory Visit (INDEPENDENT_AMBULATORY_CARE_PROVIDER_SITE_OTHER): Payer: BC Managed Care – PPO | Admitting: Gastroenterology

## 2014-09-20 VITALS — BP 112/74 | HR 80 | Ht 67.0 in | Wt 126.5 lb

## 2014-09-20 DIAGNOSIS — K5900 Constipation, unspecified: Secondary | ICD-10-CM

## 2014-09-20 DIAGNOSIS — Z86010 Personal history of colonic polyps: Secondary | ICD-10-CM

## 2014-09-20 DIAGNOSIS — K921 Melena: Secondary | ICD-10-CM

## 2014-09-20 MED ORDER — HYDROCORTISONE 2.5 % RE CREA: TOPICAL_CREAM | RECTAL | Status: DC

## 2014-09-20 NOTE — Patient Instructions (Signed)
We have sent the following medications to your pharmacy for you to pick up at your convenience: Anusol cream  Please purchase the following medications over the counter and take as directed: Preparation H suppositories once daily per rectum as needed Miralax 1 capful (18 grams) twice daily as needed  You will be due for a recall colonoscopy in 05/2016. We will send you a reminder in the mail when it gets closer to that time.  Please call us if symptoms worsen or do not improve.  We have given you instructions for rectal care. Please review and follow these instructions.  Cc Kelton Pillar, MD

## 2014-09-20 NOTE — Progress Notes (Signed)
    History of Present Illness: This is a 53 year old female with a personal history of adenomatous colon polyps and a family history of colon cancer. External hemorrhoids have been noted previously. Her last colonoscopy was in July 2012 showing only a small hyperplastic colon polyp. She reports frequent problems with constipation and 3 episodes of small amounts of bright red blood on wiping after bowel movements over several months. MiraLax helps her constipation but she would prefer not to have to use a laxative. She discontinued medications for acid reflux and she only notes very rare episodes of heartburn, approximately once per month.   Current Medications, Allergies, Past Medical History, Past Surgical History, Family History and Social History were reviewed in [employer] record.  Physical Exam: General: Well developed , well nourished, no acute distress Head: Normocephalic and atraumatic Eyes:  sclerae anicteric, EOMI Ears: Normal auditory acuity Mouth: No deformity or lesions Lungs: Clear throughout to auscultation Heart: Regular rate and rhythm; no murmurs, rubs or bruits Abdomen: Soft, non tender and non distended. No masses, hepatosplenomegaly or hernias noted. Normal Bowel sounds Rectal: Small external hemorrhoid, normal sphincter tone, no internal lesions, heme neg stool Musculoskeletal: Symmetrical with no gross deformities  Pulses:  Normal pulses noted Extremities: No clubbing, cyanosis, edema or deformities noted Neurological: Alert oriented x 4, grossly nonfocal Psychological:  Alert and cooperative. Normal mood and affect  Assessment and Recommendations:  1. Hematochezia likely due to hemorrhoids. Constipation. Miralax BID as needed. Prep H supp daily as needed. Anusol HC cream daily as needed. Rectal care instructions. Call if symptoms worsen or don't improve.  2. Personal history of adenomatous colon polyps and family history of colon cancer.  Colonoscopy in 05/2016.  3. GERD. Continue standard antireflux measures. Controlled without medications at this time.

## 2014-10-23 ENCOUNTER — Telehealth: Payer: Self-pay | Admitting: Gastroenterology

## 2014-10-23 NOTE — Telephone Encounter (Signed)
Patient reports that she has very large BMs about 1 time a week and then she feels sick.  She has not been taking Miralax on a regular basis.  She states that a whole dose of Miralax daily makes her feel full.  She is advised to try 1/2 dose of Miralax daily.  She will call back for any additional questions or concerns

## 2014-11-17 ENCOUNTER — Telehealth: Payer: Self-pay | Admitting: Gastroenterology

## 2014-11-17 NOTE — Telephone Encounter (Signed)
I have left another message for the patient.

## 2014-11-17 NOTE — Telephone Encounter (Signed)
Left message for patient to call back  

## 2014-11-21 NOTE — Telephone Encounter (Signed)
Patient is having continued problems with constipation that is irritating her hemorrhoids. I stressed the importance of keeping her bowels regular with Miralax or other laxatives and stool softeners.  She says Miralax makes her very bloated and BM unpredictable.  I have offered to send a note about possible prescription rx for constipation, she does not want to do this.  She asks what else she can try OTC.  She is advised to try a high fiber diet and benefiber.  She is cautioned that this may also cause bloating initially, but will improve with time.  She wants to try this for now. She will call back for any additional questions or concerns.

## 2014-12-26 ENCOUNTER — Telehealth: Payer: Self-pay | Admitting: Gastroenterology

## 2014-12-26 NOTE — Telephone Encounter (Signed)
Left message for patient to call back  

## 2014-12-26 NOTE — Telephone Encounter (Signed)
Patient reports that she is having excess gas despite dexilant.  She feels that she needs endo/colon.  She always feels excessively bloated.  She is not able to eat a full meal due to gas.  She will come in and see Arta Bruce, PA tomorrow at [employer]

## 2014-12-27 ENCOUNTER — Encounter: Payer: Self-pay | Admitting: Physician Assistant

## 2014-12-27 ENCOUNTER — Ambulatory Visit (INDEPENDENT_AMBULATORY_CARE_PROVIDER_SITE_OTHER): Payer: BLUE CROSS/BLUE SHIELD | Admitting: Physician Assistant

## 2014-12-27 VITALS — BP 108/74 | HR 76 | Ht 67.0 in | Wt 132.2 lb

## 2014-12-27 DIAGNOSIS — K219 Gastro-esophageal reflux disease without esophagitis: Secondary | ICD-10-CM

## 2014-12-27 DIAGNOSIS — R1314 Dysphagia, pharyngoesophageal phase: Secondary | ICD-10-CM

## 2014-12-27 MED ORDER — RANITIDINE HCL 300 MG PO CAPS: ORAL_CAPSULE | ORAL | Status: DC

## 2014-12-27 MED ORDER — PANTOPRAZOLE SODIUM 40 MG PO TBEC
DELAYED_RELEASE_TABLET | ORAL | Status: DC
Start: 2014-12-27 — End: 2015-04-25

## 2014-12-27 NOTE — Progress Notes (Signed)
Patient ID: Virginia Liu, female   DOB: [birth year], 53 y.o.   MRN: [id number]     History of Present Illness:  Virginia Liu is a 54 year old female who has been followed by Dr. Fuller Plan for constipation and GERD. She was last seen in October at which time her GERD was well controlled on no medications. Over the past several months she has been having increasing heartburn with mouthfuls of regurgitation she tried Prevacid that she had at home with no relief over the past 2 days she has tried Dexilant that she had at home. With the Dexilant, she has some relief however she states Dexilant is too expensive for her to refill. She has also been having intermittent dysphagia to solids but not to liquids.  She has a long-standing history of constipation. She has been using Mira lax but feels the [place] lax causes a lot of gas and bloating. She is frustrated that despite using the same amount of Mira lax daily she has varying results with either diarrhea or constipation. She tries to titrate the Mira lax to get the desired effect but has been unable to get the effect she wants. She complains of excess bloating and gas and feels her food does not digest. If she doesn't use Mira lax she can skip 3-4 days between bowel movements and has lower abdominal cramping prior to defecation relieved with defecation. She states her bloating and gas is affecting her life and she has had to cancel trips from visiting friends because she is embarrassed to have gas. She states she was supposed to drive to [facility] this week but was unable to do so because she she was afraid she would get gas when she was driving and not be able to control her car due to the pain in her gas causes her. She has had no bright red blood per rectum or melena. Recently she saw a dietitian who instructed her to eat raw red and green peppers. She did this and states that exacerbated her heartburn and made her gas and bloating worse. She has a folder of various diets  that various people have told her to adhere to and she tears up stating she doesn't know what to eat.   Past Medical History  Diagnosis Date  . Allergy   . GERD (gastroesophageal reflux disease)     LPR  . Adenomatous colon polyp 12/2005    Past Surgical History  Procedure Laterality Date  . Tubal ligation    . Dental examination under anesthesia    . Colonoscopy    . Polypectomy    . Upper gastrointestinal endoscopy     Family History  Problem Relation Age of Onset  . Colon cancer Father 59  . Colon polyps Brother   . Alzheimer's disease Mother   . Hypertension Mother    History  Substance Use Topics  . Smoking status: Former Smoker    Types: Cigarettes  . Smokeless tobacco: Never Used     Comment: just some college but not much or long  . Alcohol Use: 0.6 oz/week    1 Glasses of wine per week     Comment: 1-2 per month   Current Outpatient Prescriptions  Medication Sig Dispense Refill  . Calcium Carbonate-Vitamin D (CALCIUM + D) 600-200 MG-UNIT TABS Take 1 tablet by mouth daily as needed.      . cholecalciferol (VITAMIN D) 1000 UNITS tablet Take 1,000 Units by mouth daily.      . COMBIPATCH 0.05-0.14  MG/DAY Place 1 patch onto the skin 2 (two) times a week. Changes patch every 3-4 days    . fish oil-omega-3 fatty acids 1000 MG capsule Take 1 g by mouth as needed.     . hydrocortisone (ANUSOL-HC) 2.5 % rectal cream Apply rectally daily as needed 30 g 0  . loratadine (CLARITIN) 10 MG tablet Take 10 mg by mouth as needed for allergies.    . Multiple Vitamins-Minerals (CENTRUM SILVER) CHEW Chew 1 tablet by mouth as needed.     . Probiotic Product (PROBIOTIC DAILY) CAPS Take 1 capsule by mouth daily. Pt uses the [employer] brand    . RESTASIS 0.05 % ophthalmic emulsion Place 1 drop into both eyes 2 (two) times daily.     Marland Kitchen tretinoin (RETIN-A) 0.025 % cream Apply topically as needed. Pt uses for hemorrhoids  0  . pantoprazole (PROTONIX) 40 MG tablet Take 1 tab 30  min before breakfast 30 tablet 3  . ranitidine (ZANTAC) 300 MG capsule Take 1 cap at bedtime nightly. 30 capsule 3   No current facility-administered medications for this visit.   No Known Allergies    Review of Systems: Gen: Denies any fever, chills, sweats, anorexia, fatigue, weakness, malaise, weight loss, and sleep disorder CV: Denies chest pain, angina, palpitations, syncope, orthopnea, PND, peripheral edema, and claudication. Resp: Denies dyspnea at rest, dyspnea with exercise, cough, sputum, wheezing, coughing up blood, and pleurisy. GI: Denies vomiting blood, jaundice, and fecal incontinence.   Denies dysphagia or odynophagia. GU : Denies urinary burning, blood in urine, urinary frequency, urinary hesitancy, nocturnal urination, and urinary incontinence. MS: Denies joint pain, limitation of movement, and swelling, stiffness, low back pain, extremity pain. Denies muscle weakness, cramps, atrophy.  Derm: Denies rash, itching, dry skin, hives, moles, warts, or unhealing ulcers.  Psych: Denies depression, anxiety, memory loss, suicidal ideation, hallucinations, paranoia, and confusion. Heme: Denies bruising, bleeding, and enlarged lymph nodes. Neuro:  Denies any headaches, dizziness, paresthesia Endo:  Denies any problems with DM, thyroid, adrenal       Physical Exam: General: Pleasant, well developed , anxious female in no acute distress but tearful Head: Normocephalic and atraumatic Eyes:  sclerae anicteric, conjunctiva pink  Ears: Normal auditory acuity Lungs: Clear throughout to auscultation Heart: Regular rate and rhythm Abdomen: Soft, non distended, non-tender. No masses, no hepatomegaly. Normal bowel sounds Musculoskeletal: Symmetrical with no gross deformities  Extremities: No edema  Neurological: Alert oriented x 4, grossly nonfocal Psychological:  Alert and cooperative. Normal mood and affect  Assessment and Recommendations: #1 GERD. An antireflux regimen has  been reviewed at length. She feels Dexilant is too expensive, and so she will be given a trial of pantoprazole 40 mg by mouth 30 minutes prior to breakfast. She will also try ranitidine 300 mg at bedtime. As she has been having some dysphagia, an esophagram with barium pill will be obtained. Pending the results of the esophagram or response to pantoprazole, she may be a candidate for an EGD.  #2. Constipation/IBS. We have spent a considerable amount of time discussing the options of an Amitiza or Linzess. Patient is not willing to try a prescription for constipation. We have also discussed and an. Trial of low-dose Flagyl for bacterial overgrowth, and she is not willing to try this. Prefers to not use Mira lax anymore as well. She states she is not able to tolerate high fiber vegetables. At this time, we will give her a trial of Benefiber 1 tablespoon twice daily. She's  been urged to increase her water intake. She will use Phazyme as needed. She's been instructed to try adding P fruits (peaches, pears, prunes, plums, pineapple) to her diet.  Further recommendations will be made pending her response to the above.     Izreal Kock, Deloris Ping 12/27/2014,

## 2014-12-27 NOTE — Patient Instructions (Addendum)
Discontinue the Dexilant 60 mg for now. Discontinue the Miralax. Eat the fruits that start with a "P", Peaches, Pears, Prunes, Plums, Pineapple.  Increase your water intake.  We have given you antireflux  Information.  You can get Benefiber 1 tabelspoon in juice or water twice daily.  You can get  Phazyme to use to help reduce gas.   You have been scheduled for a Barium Esophogram at [facility] (1st floor of the hospital) on 01-03-2015 Tuesday at 11:00 am . Please arrive at 10:45 am prior to your appointment for registration. Make certain not to have anything to eat or drink 3 hours prior to your test. If you need to reschedule for any reason, please contact radiology at [phone] to do so. __________________________________________________________________ A barium swallow is an examination that concentrates on views of the esophagus. This tends to be a double contrast exam (barium and two liquids which, when combined, create a gas to distend the wall of the oesophagus) or single contrast (non-ionic iodine based). The study is usually tailored to your symptoms so a good history is essential. Attention is paid during the study to the form, structure and configuration of the esophagus, looking for functional disorders (such as aspiration, dysphagia, achalasia, motility and reflux) EXAMINATION You may be asked to change into a gown, depending on the type of swallow being performed. A radiologist and radiographer will perform the procedure. The radiologist will advise you of the type of contrast selected for your procedure and direct you during the exam. You will be asked to stand, sit or lie in several different positions and to hold a small amount of fluid in your mouth before being asked to swallow while the imaging is performed .In some instances you may be asked to swallow barium coated marshmallows to assess the motility of a solid food bolus. The exam can be recorded as a digital or  video fluoroscopy procedure. POST PROCEDURE It will take 1-2 days for the barium to pass through your system. To facilitate this, it is important, unless otherwise directed, to increase your fluids for the next 24-48hrs and to resume your normal diet.  This test typically takes about 30 minutes to perform. __________________________________________________________________________________

## 2014-12-27 NOTE — Progress Notes (Signed)
Reviewed and agree with management plan.  Fidela Cieslak T. Simisola Sandles, MD FACG 

## 2015-01-03 ENCOUNTER — Ambulatory Visit (HOSPITAL_COMMUNITY)
Admission: RE | Admit: 2015-01-03 | Discharge: 2015-01-03 | Disposition: A | Discharge status: Home / Self Care | Payer: BLUE CROSS/BLUE SHIELD | Source: Ambulatory Visit | Attending: Physician Assistant | Admitting: Physician Assistant

## 2015-01-03 DIAGNOSIS — K219 Gastro-esophageal reflux disease without esophagitis: Secondary | ICD-10-CM

## 2015-01-03 DIAGNOSIS — R1314 Dysphagia, pharyngoesophageal phase: Secondary | ICD-10-CM

## 2015-01-16 ENCOUNTER — Ambulatory Visit: Payer: BLUE CROSS/BLUE SHIELD | Admitting: Physician Assistant

## 2015-01-24 ENCOUNTER — Ambulatory Visit: Payer: BLUE CROSS/BLUE SHIELD | Admitting: Physician Assistant

## 2015-04-05 ENCOUNTER — Telehealth: Payer: Self-pay | Admitting: Gastroenterology

## 2015-04-05 NOTE — Telephone Encounter (Signed)
Patient is interested in having her colonoscopy earlier than next year.  She is not having success with laxatives and dietary recommendations for constipation.  She will come in on 04/25/15 9:45 to see Dr. Fuller Plan

## 2015-04-05 NOTE — Telephone Encounter (Signed)
Left message for patient to call back  

## 2015-04-09 ENCOUNTER — Telehealth: Payer: Self-pay | Admitting: Gastroenterology

## 2015-04-09 NOTE — Telephone Encounter (Signed)
Patient offered an earlier appt with Dr. Fuller Plan for this week.  She states she can't come.  She will keep the appt for 04/25/15

## 2015-04-25 ENCOUNTER — Ambulatory Visit (INDEPENDENT_AMBULATORY_CARE_PROVIDER_SITE_OTHER): Payer: BLUE CROSS/BLUE SHIELD | Admitting: Gastroenterology

## 2015-04-25 ENCOUNTER — Encounter: Payer: Self-pay | Admitting: Gastroenterology

## 2015-04-25 VITALS — BP 110/78 | HR 76 | Ht 67.0 in | Wt 131.8 lb

## 2015-04-25 DIAGNOSIS — R1032 Left lower quadrant pain: Secondary | ICD-10-CM | POA: Diagnosis not present

## 2015-04-25 DIAGNOSIS — K5900 Constipation, unspecified: Secondary | ICD-10-CM

## 2015-04-25 DIAGNOSIS — Z800 Family history of malignant neoplasm of digestive organs: Secondary | ICD-10-CM | POA: Diagnosis not present

## 2015-04-25 DIAGNOSIS — Z86010 Personal history of colonic polyps: Secondary | ICD-10-CM | POA: Diagnosis not present

## 2015-04-25 NOTE — Progress Notes (Signed)
    History of Present Illness: This is a 54 year old female with ongoing problems with constipation. She relates frequent problems with constipation and straining with bowel movements. About every 2 weeks she'll has significant difficulty with painful fecal evacuations characterized by pain in the left lower quadrant and pain in the rectum following bowel movements. She has tried various fiber supplements with its lead to bloating. MiraLAX has been somewhat successful at a low dose but has not solved her problems. Her reflux symptoms are currently not active. Denies change in stool caliber, weight loss, rectal bleeding, diarrhea.  Current Medications, Allergies, Past Medical History, Past Surgical History, Family History and Social History were reviewed in [employer] record.  Physical Exam: General: Well developed , well nourished, no acute distress Head: Normocephalic and atraumatic Eyes:  sclerae anicteric, EOMI Ears: Normal auditory acuity Mouth: No deformity or lesions Lungs: Clear throughout to auscultation Heart: Regular rate and rhythm; no murmurs, rubs or bruits Abdomen: Soft, non tender and non distended. No masses, hepatosplenomegaly or hernias noted. Normal Bowel sounds Musculoskeletal: Symmetrical with no gross deformities  Pulses:  Normal pulses noted Extremities: No clubbing, cyanosis, edema or deformities noted Neurological: Alert oriented x 4, grossly nonfocal Psychological:  Alert and cooperative. Normal mood and affect  Assessment and Recommendations:  1. Constipation. Intermittent LLQ abdominal pain and rectal with bowel hard movements. Recommended proceeding with colonoscopy however she would like to try additional measures to address constipation before colonoscopy. Increase daily water and daily fiber intake. Trial prunes and bran muffins daily as she has not tolerated Metamucil and Benefiber. Miralax 1-3 times daily, every day, titrated for  adequate daily bowel movements.   2. Personal history of adenomatous colon polyps and family history of colon cancer. Five-year interval surveillance colonoscopy is recommended in July 2017.  3. GERD. Currently symptoms inactive. Follow standard antireflux measures. Start ranitidine 300 mg daily or twice daily or pantoprazole 40 mg daily or twice daily if GERD symptoms return.

## 2015-04-25 NOTE — Patient Instructions (Signed)
Take 1-3 capfuls of Miralax daily to titrate depending on your bowel movements.   Also you can eat prunes or bran muffins to help with constipation.   Increase your water intake to at least 8 glasses daily.  Call back in 1-2 months to schedule a direct Colonoscopy if your symptoms are no better.   Thank you for choosing me and [facility].  Pricilla Riffle. Dagoberto Ligas., MD., Marval Regal   cc: Kelton Pillar, MD

## 2015-05-01 ENCOUNTER — Encounter: Payer: Self-pay | Admitting: Gastroenterology

## 2015-05-30 ENCOUNTER — Encounter: Payer: Self-pay | Admitting: Gastroenterology

## 2015-05-30 ENCOUNTER — Ambulatory Visit (AMBULATORY_SURGERY_CENTER): Payer: Self-pay | Admitting: *Deleted

## 2015-05-30 VITALS — Ht 67.5 in | Wt 128.8 lb

## 2015-05-30 DIAGNOSIS — Z86010 Personal history of colonic polyps: Secondary | ICD-10-CM

## 2015-05-30 DIAGNOSIS — Z800 Family history of malignant neoplasm of digestive organs: Secondary | ICD-10-CM

## 2015-05-30 MED ORDER — NA SULFATE-K SULFATE-MG SULF 17.5-3.13-1.6 GM/177ML PO SOLN: 1.0000 | Freq: Once | ORAL | Status: DC

## 2015-05-30 NOTE — Progress Notes (Signed)
Brother and uncle both have recently had a surgery and both had a heart attack on the table . Pt concerned. 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 Pt concerned about these occurances in her family. Pt herself has had no issues with past sedation No allergy to eggs or soy No home 02+ No diet pills

## 2015-05-31 ENCOUNTER — Encounter: Payer: Self-pay | Admitting: Gastroenterology

## 2015-06-01 ENCOUNTER — Telehealth: Payer: Self-pay | Admitting: Gastroenterology

## 2015-06-01 NOTE — Telephone Encounter (Signed)
Left a message to call back.

## 2015-06-01 NOTE — Telephone Encounter (Signed)
Spoke with patient and she has not had a bowel movement on 05/25/15. She has only taken 1 or 1/12 dose of Miralax. She will increase up to 3 doses/day as per Dr. Lynne Leader last [place] note.

## 2015-06-12 ENCOUNTER — Ambulatory Visit (AMBULATORY_SURGERY_CENTER): Payer: BLUE CROSS/BLUE SHIELD | Admitting: Gastroenterology

## 2015-06-12 ENCOUNTER — Encounter: Payer: Self-pay | Admitting: Gastroenterology

## 2015-06-12 VITALS — BP 131/60 | HR 55 | Temp 98.8°F | Resp 21 | Ht 67.0 in | Wt 128.0 lb

## 2015-06-12 DIAGNOSIS — D123 Benign neoplasm of transverse colon: Secondary | ICD-10-CM

## 2015-06-12 DIAGNOSIS — Z800 Family history of malignant neoplasm of digestive organs: Secondary | ICD-10-CM

## 2015-06-12 DIAGNOSIS — Z86010 Personal history of colonic polyps: Secondary | ICD-10-CM | POA: Diagnosis present

## 2015-06-12 MED ORDER — SODIUM CHLORIDE 0.9 % IV SOLN: 500.0000 mL | INTRAVENOUS | Status: DC

## 2015-06-12 NOTE — Progress Notes (Signed)
Report to PACU, RN, vss, BBS= Clear.  

## 2015-06-12 NOTE — Op Note (Signed)
[address]  [employer]. [place], [address]   COLONOSCOPY PROCEDURE REPORT  PATIENT: Virginia, Liu  MR#: [id number] BIRTHDATE: March 07, 1961 , 68  yrs. old GENDER: female ENDOSCOPIST: Ladene Artist, MD, [facility] PROCEDURE DATE:  06/12/2015 PROCEDURE:   Colonoscopy, surveillance and Colonoscopy with snare polypectomy First Screening Colonoscopy - Avg.  risk and is 50 yrs.  old or older - No.  Prior Negative Screening - Now for repeat screening. N/A  History of Adenoma - Now for follow-up colonoscopy & has been > or = to 3 yrs.  Yes hx of adenoma.  Has been 3 or more years since last colonoscopy.  Polyps removed today? Yes ASA CLASS:   Class II INDICATIONS:Surveillance due to prior colonic neoplasia, PH Colon Adenoma, and FH Colon or Rectal Adenocarcinoma. MEDICATIONS: Monitored anesthesia care, Propofol 240 mg IV, and Glycopyrrolate (Robinul) 0.2 mg IV DESCRIPTION OF PROCEDURE:   After the risks benefits and alternatives of the procedure were thoroughly explained, informed consent was obtained.  The digital rectal exam revealed external hemorrhoids.   The LB [id number]-[id number]  endoscope was introduced through the anus and advanced to the cecum, which was identified by both the appendix and ileocecal valve. No adverse events experienced with a tortuous colon.   The quality of the prep was excellent.  (Suprep was used)  The instrument was then slowly withdrawn as the colon was fully examined. Estimated blood loss is zero unless otherwise noted in this procedure report.    COLON FINDINGS: Three sessile polyps measuring 5 mm in size were found in the transverse colon.  Polypectomies were performed with a cold snare.  The resection was complete, the polyp tissue was completely retrieved and sent to histology.   The examination was otherwise normal.  Retroflexed views revealed internal Grade I hemorrhoids. The time to cecum = 4.8 Withdrawal time = 14.4   The scope  was withdrawn and the procedure completed. COMPLICATIONS: There were no immediate complications.  ENDOSCOPIC IMPRESSION: 1.   Three sessile polyps in the transverse colon; polypectomies performed with a cold snare 2.   Internal and external hemorrhoids  RECOMMENDATIONS: 1.  Await pathology results 2.  Repeat Colonoscopy in 5 years.  eSigned:  Ladene Artist, MD, Marval Regal 06/12/2015 9:00 AM   cc: Kelton Pillar, MD

## 2015-06-12 NOTE — Patient Instructions (Signed)
YOU HAD AN ENDOSCOPIC PROCEDURE TODAY AT [facility]:   Refer to the procedure report that was given to you for any specific questions about what was found during the examination.  If the procedure report does not answer your questions, please call your gastroenterologist to clarify.  If you requested that your care partner not be given the details of your procedure findings, then the procedure report has been included in a sealed envelope for you to review at your convenience later.  YOU SHOULD EXPECT: Some feelings of bloating in the abdomen. Passage of more gas than usual.  Walking can help get rid of the air that was put into your GI tract during the procedure and reduce the bloating. If you had a lower endoscopy (such as a colonoscopy or flexible sigmoidoscopy) you may notice spotting of blood in your stool or on the toilet paper. If you underwent a bowel prep for your procedure, you may not have a normal bowel movement for a few days.  Please Note:  You might notice some irritation and congestion in your nose or some drainage.  This is from the oxygen used during your procedure.  There is no need for concern and it should clear up in a day or so.  SYMPTOMS TO REPORT IMMEDIATELY:   Following lower endoscopy (colonoscopy or flexible sigmoidoscopy):  Excessive amounts of blood in the stool  Significant tenderness or worsening of abdominal pains  Swelling of the abdomen that is new, acute  Fever of 100F or higher   For urgent or emergent issues, a gastroenterologist can be reached at any hour by calling [phone].   DIET: Your first meal following the procedure should be a small meal and then it is ok to progress to your normal diet. Heavy or fried foods are harder to digest and may make you feel nauseous or bloated.  Likewise, meals heavy in dairy and vegetables can increase bloating.  Drink plenty of fluids but you should avoid alcoholic beverages for 24  hours.  ACTIVITY:  You should plan to take it easy for the rest of today and you should NOT DRIVE or use heavy machinery until tomorrow (because of the sedation medicines used during the test).    FOLLOW UP: Our staff will call the number listed on your records the next business day following your procedure to check on you and address any questions or concerns that you may have regarding the information given to you following your procedure. If we do not reach you, we will leave a message.  However, if you are feeling well and you are not experiencing any problems, there is no need to return our call.  We will assume that you have returned to your regular daily activities without incident.  If any biopsies were taken you will be contacted by phone or by letter within the next 1-3 weeks.  Please call us at [phone] if you have not heard about the biopsies in 3 weeks.    SIGNATURES/CONFIDENTIALITY: You and/or your care partner have signed paperwork which will be entered into your electronic medical record.  These signatures attest to the fact that that the information above on your After Visit Summary has been reviewed and is understood.  Full responsibility of the confidentiality of this discharge information lies with you and/or your care-partner.  Await pathology report Repeat Colonoscopy in 5 years

## 2015-06-12 NOTE — Progress Notes (Signed)
Called to room to assist during endoscopic procedure.  Patient ID and intended procedure confirmed with present staff. Received instructions for my participation in the procedure from the performing physician.  

## 2015-06-13 ENCOUNTER — Telehealth: Payer: Self-pay | Admitting: Gastroenterology

## 2015-06-13 ENCOUNTER — Telehealth: Payer: Self-pay | Admitting: *Deleted

## 2015-06-13 ENCOUNTER — Other Ambulatory Visit: Payer: Self-pay | Admitting: Obstetrics and Gynecology

## 2015-06-13 NOTE — Telephone Encounter (Signed)
Normal to see black, green, yellow, brown stool. Melena or red blood is concerning.

## 2015-06-13 NOTE — Telephone Encounter (Signed)
Left message for patient to call back  

## 2015-06-13 NOTE — Telephone Encounter (Signed)
  Follow up Call-  Call back number 06/12/2015  Post procedure Call Back phone  # [phone]  Permission to leave phone message Yes     Patient questions:  Do you have a fever, pain , or abdominal swelling? No. Pain Score  0 *  Have you tolerated food without any problems? Yes.    Have you been able to return to your normal activities? Yes.    Do you have any questions about your discharge instructions: Diet   No. Medications  No. Follow up visit  No.  Do you have questions or concerns about your Care? No.  Actions: * If pain score is 4 or above: No action needed, pain <4.

## 2015-06-14 LAB — CYTOLOGY - PAP

## 2015-06-14 NOTE — Telephone Encounter (Signed)
Patient reports no other complaints.  She is feeling fine.

## 2015-06-14 NOTE — Telephone Encounter (Signed)
Left message for patient to call back  

## 2015-06-21 ENCOUNTER — Encounter: Payer: Self-pay | Admitting: Gastroenterology

## 2016-03-12 ENCOUNTER — Other Ambulatory Visit: Payer: Self-pay | Admitting: Gastroenterology

## 2016-03-26 DIAGNOSIS — H04123 Dry eye syndrome of bilateral lacrimal glands: Secondary | ICD-10-CM | POA: Diagnosis not present

## 2016-03-26 DIAGNOSIS — H5213 Myopia, bilateral: Secondary | ICD-10-CM | POA: Diagnosis not present

## 2016-04-22 DIAGNOSIS — M25572 Pain in left ankle and joints of left foot: Secondary | ICD-10-CM | POA: Diagnosis not present

## 2016-06-09 DIAGNOSIS — M25561 Pain in right knee: Secondary | ICD-10-CM | POA: Diagnosis not present

## 2016-07-24 DIAGNOSIS — B078 Other viral warts: Secondary | ICD-10-CM | POA: Diagnosis not present

## 2016-07-24 DIAGNOSIS — L820 Inflamed seborrheic keratosis: Secondary | ICD-10-CM | POA: Diagnosis not present

## 2016-08-11 DIAGNOSIS — Z6820 Body mass index (BMI) 20.0-20.9, adult: Secondary | ICD-10-CM | POA: Diagnosis not present

## 2016-08-11 DIAGNOSIS — Z01419 Encounter for gynecological examination (general) (routine) without abnormal findings: Secondary | ICD-10-CM | POA: Diagnosis not present

## 2016-08-12 DIAGNOSIS — L237 Allergic contact dermatitis due to plants, except food: Secondary | ICD-10-CM | POA: Diagnosis not present

## 2016-08-25 DIAGNOSIS — Z131 Encounter for screening for diabetes mellitus: Secondary | ICD-10-CM | POA: Diagnosis not present

## 2016-08-25 DIAGNOSIS — Z130 Encounter for screening for diseases of the blood and blood-forming organs and certain disorders involving the immune mechanism: Secondary | ICD-10-CM | POA: Diagnosis not present

## 2016-08-25 DIAGNOSIS — Z13220 Encounter for screening for lipoid disorders: Secondary | ICD-10-CM | POA: Diagnosis not present

## 2016-08-25 DIAGNOSIS — Z13228 Encounter for screening for other metabolic disorders: Secondary | ICD-10-CM | POA: Diagnosis not present

## 2016-08-26 DIAGNOSIS — Z1231 Encounter for screening mammogram for malignant neoplasm of breast: Secondary | ICD-10-CM | POA: Diagnosis not present

## 2016-09-01 DIAGNOSIS — M25561 Pain in right knee: Secondary | ICD-10-CM | POA: Diagnosis not present

## 2016-09-09 DIAGNOSIS — M25561 Pain in right knee: Secondary | ICD-10-CM | POA: Diagnosis not present

## 2016-09-15 DIAGNOSIS — M25561 Pain in right knee: Secondary | ICD-10-CM | POA: Diagnosis not present

## 2016-09-15 DIAGNOSIS — M94261 Chondromalacia, right knee: Secondary | ICD-10-CM | POA: Diagnosis not present

## 2016-09-16 DIAGNOSIS — Z13820 Encounter for screening for osteoporosis: Secondary | ICD-10-CM | POA: Diagnosis not present

## 2016-10-22 DIAGNOSIS — L309 Dermatitis, unspecified: Secondary | ICD-10-CM | POA: Diagnosis not present

## 2016-11-13 DIAGNOSIS — J3089 Other allergic rhinitis: Secondary | ICD-10-CM | POA: Diagnosis not present

## 2016-11-13 DIAGNOSIS — J301 Allergic rhinitis due to pollen: Secondary | ICD-10-CM | POA: Diagnosis not present

## 2016-11-13 DIAGNOSIS — J3081 Allergic rhinitis due to animal (cat) (dog) hair and dander: Secondary | ICD-10-CM | POA: Diagnosis not present

## 2016-11-13 DIAGNOSIS — H1045 Other chronic allergic conjunctivitis: Secondary | ICD-10-CM | POA: Diagnosis not present

## 2016-11-20 DIAGNOSIS — M25561 Pain in right knee: Secondary | ICD-10-CM | POA: Diagnosis not present

## 2016-12-04 DIAGNOSIS — J069 Acute upper respiratory infection, unspecified: Secondary | ICD-10-CM | POA: Diagnosis not present

## 2017-02-18 DIAGNOSIS — N393 Stress incontinence (female) (male): Secondary | ICD-10-CM | POA: Diagnosis not present

## 2017-03-02 DIAGNOSIS — N393 Stress incontinence (female) (male): Secondary | ICD-10-CM | POA: Diagnosis not present

## 2017-03-03 DIAGNOSIS — H938X1 Other specified disorders of right ear: Secondary | ICD-10-CM | POA: Insufficient documentation

## 2017-03-03 HISTORY — DX: Other specified disorders of right ear: H93.8X1

## 2017-03-23 DIAGNOSIS — R3915 Urgency of urination: Secondary | ICD-10-CM | POA: Diagnosis not present

## 2017-03-23 DIAGNOSIS — N393 Stress incontinence (female) (male): Secondary | ICD-10-CM | POA: Diagnosis not present

## 2017-03-23 DIAGNOSIS — N3281 Overactive bladder: Secondary | ICD-10-CM | POA: Diagnosis not present

## 2017-03-25 DIAGNOSIS — L308 Other specified dermatitis: Secondary | ICD-10-CM | POA: Diagnosis not present

## 2017-03-25 DIAGNOSIS — N393 Stress incontinence (female) (male): Secondary | ICD-10-CM | POA: Diagnosis not present

## 2017-03-25 DIAGNOSIS — N3281 Overactive bladder: Secondary | ICD-10-CM | POA: Diagnosis not present

## 2017-03-25 DIAGNOSIS — M6281 Muscle weakness (generalized): Secondary | ICD-10-CM | POA: Diagnosis not present

## 2017-03-25 DIAGNOSIS — I788 Other diseases of capillaries: Secondary | ICD-10-CM | POA: Diagnosis not present

## 2017-03-25 DIAGNOSIS — M62838 Other muscle spasm: Secondary | ICD-10-CM | POA: Diagnosis not present

## 2017-04-02 DIAGNOSIS — K5900 Constipation, unspecified: Secondary | ICD-10-CM | POA: Diagnosis not present

## 2017-04-02 DIAGNOSIS — N3946 Mixed incontinence: Secondary | ICD-10-CM | POA: Diagnosis not present

## 2017-04-02 DIAGNOSIS — M6281 Muscle weakness (generalized): Secondary | ICD-10-CM | POA: Diagnosis not present

## 2017-04-02 DIAGNOSIS — R3912 Poor urinary stream: Secondary | ICD-10-CM | POA: Diagnosis not present

## 2017-04-16 DIAGNOSIS — D2261 Melanocytic nevi of right upper limb, including shoulder: Secondary | ICD-10-CM | POA: Diagnosis not present

## 2017-04-16 DIAGNOSIS — L84 Corns and callosities: Secondary | ICD-10-CM | POA: Diagnosis not present

## 2017-04-16 DIAGNOSIS — L308 Other specified dermatitis: Secondary | ICD-10-CM | POA: Diagnosis not present

## 2017-04-16 DIAGNOSIS — L738 Other specified follicular disorders: Secondary | ICD-10-CM | POA: Diagnosis not present

## 2017-04-22 DIAGNOSIS — M6281 Muscle weakness (generalized): Secondary | ICD-10-CM | POA: Diagnosis not present

## 2017-04-22 DIAGNOSIS — N3946 Mixed incontinence: Secondary | ICD-10-CM | POA: Diagnosis not present

## 2017-04-22 DIAGNOSIS — K5900 Constipation, unspecified: Secondary | ICD-10-CM | POA: Diagnosis not present

## 2017-04-22 DIAGNOSIS — R3912 Poor urinary stream: Secondary | ICD-10-CM | POA: Diagnosis not present

## 2017-05-05 DIAGNOSIS — N3946 Mixed incontinence: Secondary | ICD-10-CM | POA: Diagnosis not present

## 2017-05-05 DIAGNOSIS — M6281 Muscle weakness (generalized): Secondary | ICD-10-CM | POA: Diagnosis not present

## 2017-05-05 DIAGNOSIS — K5900 Constipation, unspecified: Secondary | ICD-10-CM | POA: Diagnosis not present

## 2017-05-05 DIAGNOSIS — R3912 Poor urinary stream: Secondary | ICD-10-CM | POA: Diagnosis not present

## 2017-07-28 DIAGNOSIS — H04123 Dry eye syndrome of bilateral lacrimal glands: Secondary | ICD-10-CM | POA: Diagnosis not present

## 2017-07-28 DIAGNOSIS — H524 Presbyopia: Secondary | ICD-10-CM | POA: Diagnosis not present

## 2017-08-17 DIAGNOSIS — Z01419 Encounter for gynecological examination (general) (routine) without abnormal findings: Secondary | ICD-10-CM | POA: Diagnosis not present

## 2017-08-17 DIAGNOSIS — Z681 Body mass index (BMI) 19 or less, adult: Secondary | ICD-10-CM | POA: Diagnosis not present

## 2017-08-24 ENCOUNTER — Telehealth: Payer: Self-pay | Admitting: Gastroenterology

## 2017-08-24 NOTE — Telephone Encounter (Signed)
Patient reports that she has GERD and wants to resume pantoprazole.  She has not been seen since 2016.  I gave her an appt for 11/14.  She is asking for a refill of pantoprazole until OV? She understands you are out of the office until next week?

## 2017-08-26 NOTE — Telephone Encounter (Signed)
OK to refill pantoprazole 40 mg po daily for 1 year

## 2017-08-27 MED ORDER — PANTOPRAZOLE SODIUM 40 MG PO TBEC: 40.0000 mg | DELAYED_RELEASE_TABLET | Freq: Every day | ORAL | 11 refills | Status: DC

## 2017-08-27 NOTE — Telephone Encounter (Signed)
Patient notified  She will keep her appt for November

## 2017-09-07 DIAGNOSIS — Z0000 Encounter for general adult medical examination without abnormal findings: Secondary | ICD-10-CM | POA: Diagnosis not present

## 2017-09-07 DIAGNOSIS — Z13220 Encounter for screening for lipoid disorders: Secondary | ICD-10-CM | POA: Diagnosis not present

## 2017-09-07 DIAGNOSIS — Z131 Encounter for screening for diabetes mellitus: Secondary | ICD-10-CM | POA: Diagnosis not present

## 2017-09-07 DIAGNOSIS — E559 Vitamin D deficiency, unspecified: Secondary | ICD-10-CM | POA: Diagnosis not present

## 2017-09-07 DIAGNOSIS — K219 Gastro-esophageal reflux disease without esophagitis: Secondary | ICD-10-CM | POA: Diagnosis not present

## 2017-09-29 DIAGNOSIS — Z23 Encounter for immunization: Secondary | ICD-10-CM | POA: Diagnosis not present

## 2017-10-01 ENCOUNTER — Ambulatory Visit: Payer: BLUE CROSS/BLUE SHIELD | Admitting: Nurse Practitioner

## 2017-10-14 ENCOUNTER — Ambulatory Visit: Payer: BLUE CROSS/BLUE SHIELD | Admitting: Gastroenterology

## 2017-12-21 DIAGNOSIS — Z1231 Encounter for screening mammogram for malignant neoplasm of breast: Secondary | ICD-10-CM | POA: Diagnosis not present

## 2018-01-20 DIAGNOSIS — L723 Sebaceous cyst: Secondary | ICD-10-CM | POA: Diagnosis not present

## 2018-02-05 ENCOUNTER — Telehealth: Payer: Self-pay | Admitting: Gastroenterology

## 2018-02-05 NOTE — Telephone Encounter (Signed)
Patient states her obgyn advised her to she CCS for her hemorrhoids. Patient wants to know what Dr.Stark advises.

## 2018-02-05 NOTE — Telephone Encounter (Signed)
Patient is advised that we could schedule her an appt to have her hemorrhoids looked at and determine if they can be banded.  She has an appt with the surgeon prior to any available banding appts.  She will meet with her and call back if she wants to try and have hemorrhoids banded.

## 2018-02-12 ENCOUNTER — Telehealth: Payer: Self-pay

## 2018-02-12 NOTE — Telephone Encounter (Signed)
SENT REFERRAL TO SCHEDULING FROM DR Margaretha Sheffield GRIFFIN PH# [phone]

## 2018-03-01 DIAGNOSIS — K641 Second degree hemorrhoids: Secondary | ICD-10-CM | POA: Diagnosis not present

## 2018-04-20 DIAGNOSIS — J3081 Allergic rhinitis due to animal (cat) (dog) hair and dander: Secondary | ICD-10-CM | POA: Diagnosis not present

## 2018-04-20 DIAGNOSIS — J3089 Other allergic rhinitis: Secondary | ICD-10-CM | POA: Diagnosis not present

## 2018-04-20 DIAGNOSIS — J301 Allergic rhinitis due to pollen: Secondary | ICD-10-CM | POA: Diagnosis not present

## 2018-04-20 DIAGNOSIS — H1045 Other chronic allergic conjunctivitis: Secondary | ICD-10-CM | POA: Diagnosis not present

## 2018-06-21 ENCOUNTER — Encounter: Payer: Self-pay | Admitting: *Deleted

## 2018-07-04 NOTE — Progress Notes (Signed)
Cardiology Office Note   Date:  07/05/2018   ID:  Virginia Liu, DOB 03-Jun-1961, MRN [id number]  PCP:  Kelton Pillar, MD  Cardiologist:   Dorris Carnes, MD   Pt referred for cardiac risk stratification by [name]    History of Present Illness: Virginia Liu is a 57 y.o. female with no definite cardiac history    Pt seen by [name]   Complained of dizziness  Related to temperatures   Recently [place], felt bad   No syncope   Recovered   Admits to not drinking enough fluids. Pt also notes an irregular heart beat   No dizziness   Usually at night   Isolated beats    Does not notice with activity Pt's history is complicated   Paternal great uncle died of MI after surgery  Paternal GF died MI in 60s    Mother with atrial fib   Maternal aunt ( a smoker) had MI in 50s   Pt's brother had surgery and ended up in ICU   ? BP related   Did not have cath   Pt does not know more of Hx  The pt denies CP    She exercises some   Breathing is OK      Current Meds  Medication Sig  . Azelaic Acid (FINACEA EX) Apply topically as needed. For rosecea  . cholecalciferol (VITAMIN D) 1000 UNITS tablet Take 1,000 Units by mouth daily.    . Multiple Vitamins-Minerals (CENTRUM SILVER) CHEW Chew 1 tablet by mouth as needed.   . tretinoin (RETIN-A) 0.025 % cream Apply topically as needed. Pt uses for hemorrhoids     Allergies:   Latex   Past Medical History:  Diagnosis Date  . ABDOMINAL PAIN-EPIGASTRIC 01/15/2010   Qualifier: Diagnosis of  By: Fuller Plan MD Lamont Snowball T   . ABDOMINAL PAIN-RUQ 05/01/2008   Qualifier: Diagnosis of  By: Fuller Plan MD Marijo Conception Adenomatous colon polyp 12/2005  . ADENOMATOUS COLONIC POLYP 12/17/2005   Qualifier: Diagnosis of  By: Julaine Hua CMA ([place]), Estill Bamberg    . Allergy   . Ankle fracture   . Dry eye    restasis use   . Ear pressure, right 03/03/2017  . GERD 05/01/2008   Qualifier: Diagnosis of  By: Julaine Hua CMA ([place]), Estill Bamberg    . GERD  (gastroesophageal reflux disease)    LPR  . Plantar fasciitis 12/24/2011   Left shows a small tear at insertion and very thick  RT shows old changes w large spur   . Syncope    seems to vagal with sight of blood    Past Surgical History:  Procedure Laterality Date  . COLONOSCOPY    . DENTAL EXAMINATION UNDER ANESTHESIA    . POLYPECTOMY    . TUBAL LIGATION    . UPPER GASTROINTESTINAL ENDOSCOPY       Social History:  The patient  reports that she has quit smoking. Her smoking use included cigarettes. She has never used smokeless tobacco. She reports that she drinks about 0.6 oz of alcohol per week. She reports that she does not use drugs.   Family History:  The patient's family history includes Alzheimer's disease in her mother; Colon cancer (age of onset: 54) in her father; Colon polyps in her brother; Heart failure in her mother; Hypertension in her mother.    ROS:  Please see the history of present illness. All other systems are reviewed  and  Negative to the above problem except as noted.    PHYSICAL EXAM: VS:  BP 122/82 (BP Location: Left Arm, Patient Position: Sitting, Cuff Size: Normal)   Pulse 68   Ht 5\' 7"  (1.702 m)   Wt 125 lb 12.8 oz (57.1 kg)   BMI 19.70 kg/m   GEN:  Thin 57 yo  in no acute distress  HEENT: normal  Neck: no JVD, carotid bruits, or masses Cardiac: RRR; no murmurs, rubs, or gallops,no edema  Respiratory:  clear to auscultation bilaterally, normal work of breathing GI: soft, nontender, nondistended, + BS  No hepatomegaly  MS: no deformity Moving all extremities   Skin: warm and dry, no rash Neuro:  Strength and sensation are intact Psych: euthymic mood, full affect   EKG:  EKG is ordered today.  SR  68 bpm     Lipid Panel No results found for: CHOL, TRIG, HDL, CHOLHDL, VLDL, LDLCALC, LDLDIRECT    Wt Readings from Last 3 Encounters:  07/05/18 125 lb 12.8 oz (57.1 kg)  06/12/15 128 lb (58.1 kg)  05/30/15 128 lb 12.8 oz (58.4 kg)       ASSESSMENT AND PLAN:  Pt with several concerns   I am not convinced of any acitve cardiac problems  1   Irreg heart beat   Sounds infrequent    Isolated beats    No hemodyn complications. Stay hydrated   Avoid stimulants    Call if become more frequent  Stay active  2  Dizziness   Sounds orthostatic in origin  Prob exacerbated by inadquate fluid intake  3   Cardiac risk   FHx does not have early CAD   Brothers hx unclear   ? Reaction to anesthetic    I asked the pt to confirm    ON review, the pt is nonsmoker, not diabetic   Lipid sare excellent I think she is at low risk     I would again clarify brothers history   Encouraged her to stay active Call if symptoms change.  Dorris Carnes    Current medicines are reviewed at length with the patient today.  The patient does not have concerns regarding medicines.  Signed, Dorris Carnes, MD  07/05/2018 11:54 AM    [address], [place], [place]  [address] Phone: [phone]; Fax: [phone]

## 2018-07-05 ENCOUNTER — Encounter

## 2018-07-05 ENCOUNTER — Encounter (INDEPENDENT_AMBULATORY_CARE_PROVIDER_SITE_OTHER): Payer: Self-pay

## 2018-07-05 ENCOUNTER — Encounter: Payer: Self-pay | Admitting: Internal Medicine

## 2018-07-05 ENCOUNTER — Ambulatory Visit (INDEPENDENT_AMBULATORY_CARE_PROVIDER_SITE_OTHER): Payer: BLUE CROSS/BLUE SHIELD | Admitting: Internal Medicine

## 2018-07-05 VITALS — BP 122/82 | HR 68 | Ht 67.0 in | Wt 125.8 lb

## 2018-07-05 DIAGNOSIS — R42 Dizziness and giddiness: Secondary | ICD-10-CM | POA: Diagnosis not present

## 2018-07-05 DIAGNOSIS — R002 Palpitations: Secondary | ICD-10-CM | POA: Diagnosis not present

## 2018-07-05 NOTE — Patient Instructions (Signed)
Your physician recommends that you continue on your current medications as directed. Please refer to the Current Medication list given to you today. Your physician recommends that you schedule a follow-up appointment as needed with Dr. Harrington Challenger.

## 2018-07-08 ENCOUNTER — Other Ambulatory Visit (INDEPENDENT_AMBULATORY_CARE_PROVIDER_SITE_OTHER): Payer: BLUE CROSS/BLUE SHIELD

## 2018-07-08 DIAGNOSIS — R002 Palpitations: Secondary | ICD-10-CM | POA: Diagnosis not present

## 2018-07-30 DIAGNOSIS — H5213 Myopia, bilateral: Secondary | ICD-10-CM | POA: Diagnosis not present

## 2018-08-18 DIAGNOSIS — D2261 Melanocytic nevi of right upper limb, including shoulder: Secondary | ICD-10-CM | POA: Diagnosis not present

## 2018-08-18 DIAGNOSIS — D2271 Melanocytic nevi of right lower limb, including hip: Secondary | ICD-10-CM | POA: Diagnosis not present

## 2018-08-18 DIAGNOSIS — D1801 Hemangioma of skin and subcutaneous tissue: Secondary | ICD-10-CM | POA: Diagnosis not present

## 2018-08-18 DIAGNOSIS — B078 Other viral warts: Secondary | ICD-10-CM | POA: Diagnosis not present

## 2018-08-19 DIAGNOSIS — Z6820 Body mass index (BMI) 20.0-20.9, adult: Secondary | ICD-10-CM | POA: Diagnosis not present

## 2018-08-19 DIAGNOSIS — E559 Vitamin D deficiency, unspecified: Secondary | ICD-10-CM | POA: Diagnosis not present

## 2018-08-19 DIAGNOSIS — Z01419 Encounter for gynecological examination (general) (routine) without abnormal findings: Secondary | ICD-10-CM | POA: Diagnosis not present

## 2018-09-21 DIAGNOSIS — Z13220 Encounter for screening for lipoid disorders: Secondary | ICD-10-CM | POA: Diagnosis not present

## 2018-09-21 DIAGNOSIS — Z0000 Encounter for general adult medical examination without abnormal findings: Secondary | ICD-10-CM | POA: Diagnosis not present

## 2018-09-21 DIAGNOSIS — Z131 Encounter for screening for diabetes mellitus: Secondary | ICD-10-CM | POA: Diagnosis not present

## 2018-09-21 DIAGNOSIS — Z23 Encounter for immunization: Secondary | ICD-10-CM | POA: Diagnosis not present

## 2018-10-04 DIAGNOSIS — K641 Second degree hemorrhoids: Secondary | ICD-10-CM | POA: Diagnosis not present

## 2018-11-18 DIAGNOSIS — H0012 Chalazion right lower eyelid: Secondary | ICD-10-CM | POA: Diagnosis not present

## 2018-11-25 DIAGNOSIS — E559 Vitamin D deficiency, unspecified: Secondary | ICD-10-CM | POA: Diagnosis not present

## 2018-11-25 DIAGNOSIS — R5383 Other fatigue: Secondary | ICD-10-CM | POA: Diagnosis not present

## 2018-12-15 DIAGNOSIS — H0012 Chalazion right lower eyelid: Secondary | ICD-10-CM | POA: Diagnosis not present

## 2019-01-12 DIAGNOSIS — Z1231 Encounter for screening mammogram for malignant neoplasm of breast: Secondary | ICD-10-CM | POA: Diagnosis not present

## 2019-01-19 DIAGNOSIS — J301 Allergic rhinitis due to pollen: Secondary | ICD-10-CM | POA: Diagnosis not present

## 2019-01-19 DIAGNOSIS — J3089 Other allergic rhinitis: Secondary | ICD-10-CM | POA: Diagnosis not present

## 2019-01-19 DIAGNOSIS — H1045 Other chronic allergic conjunctivitis: Secondary | ICD-10-CM | POA: Diagnosis not present

## 2019-01-19 DIAGNOSIS — J3081 Allergic rhinitis due to animal (cat) (dog) hair and dander: Secondary | ICD-10-CM | POA: Diagnosis not present

## 2019-03-10 DIAGNOSIS — D2271 Melanocytic nevi of right lower limb, including hip: Secondary | ICD-10-CM | POA: Diagnosis not present

## 2019-03-10 DIAGNOSIS — L821 Other seborrheic keratosis: Secondary | ICD-10-CM | POA: Diagnosis not present

## 2019-03-10 DIAGNOSIS — D1801 Hemangioma of skin and subcutaneous tissue: Secondary | ICD-10-CM | POA: Diagnosis not present

## 2019-08-02 DIAGNOSIS — H5213 Myopia, bilateral: Secondary | ICD-10-CM | POA: Diagnosis not present

## 2019-08-02 DIAGNOSIS — H01002 Unspecified blepharitis right lower eyelid: Secondary | ICD-10-CM | POA: Diagnosis not present

## 2019-08-02 DIAGNOSIS — H01005 Unspecified blepharitis left lower eyelid: Secondary | ICD-10-CM | POA: Diagnosis not present

## 2019-09-23 DIAGNOSIS — Z0000 Encounter for general adult medical examination without abnormal findings: Secondary | ICD-10-CM | POA: Diagnosis not present

## 2019-09-23 DIAGNOSIS — Z13220 Encounter for screening for lipoid disorders: Secondary | ICD-10-CM | POA: Diagnosis not present

## 2019-09-23 DIAGNOSIS — E559 Vitamin D deficiency, unspecified: Secondary | ICD-10-CM | POA: Diagnosis not present

## 2019-09-23 DIAGNOSIS — K219 Gastro-esophageal reflux disease without esophagitis: Secondary | ICD-10-CM | POA: Diagnosis not present

## 2019-11-15 DIAGNOSIS — Z01419 Encounter for gynecological examination (general) (routine) without abnormal findings: Secondary | ICD-10-CM | POA: Diagnosis not present

## 2019-11-15 DIAGNOSIS — Z13820 Encounter for screening for osteoporosis: Secondary | ICD-10-CM | POA: Diagnosis not present

## 2019-11-15 DIAGNOSIS — Z6820 Body mass index (BMI) 20.0-20.9, adult: Secondary | ICD-10-CM | POA: Diagnosis not present

## 2020-01-09 DIAGNOSIS — H1131 Conjunctival hemorrhage, right eye: Secondary | ICD-10-CM | POA: Diagnosis not present

## 2020-01-10 DIAGNOSIS — J3081 Allergic rhinitis due to animal (cat) (dog) hair and dander: Secondary | ICD-10-CM | POA: Diagnosis not present

## 2020-01-10 DIAGNOSIS — J301 Allergic rhinitis due to pollen: Secondary | ICD-10-CM | POA: Diagnosis not present

## 2020-01-10 DIAGNOSIS — J3089 Other allergic rhinitis: Secondary | ICD-10-CM | POA: Diagnosis not present

## 2020-01-10 DIAGNOSIS — H1045 Other chronic allergic conjunctivitis: Secondary | ICD-10-CM | POA: Diagnosis not present

## 2020-04-04 DIAGNOSIS — H9311 Tinnitus, right ear: Secondary | ICD-10-CM | POA: Diagnosis not present

## 2020-04-04 DIAGNOSIS — H93291 Other abnormal auditory perceptions, right ear: Secondary | ICD-10-CM | POA: Diagnosis not present

## 2020-04-04 DIAGNOSIS — Z0110 Encounter for examination of ears and hearing without abnormal findings: Secondary | ICD-10-CM | POA: Diagnosis not present

## 2020-05-03 DIAGNOSIS — Z1231 Encounter for screening mammogram for malignant neoplasm of breast: Secondary | ICD-10-CM | POA: Diagnosis not present

## 2020-08-07 DIAGNOSIS — H5213 Myopia, bilateral: Secondary | ICD-10-CM | POA: Diagnosis not present

## 2020-08-29 ENCOUNTER — Encounter: Payer: BLUE CROSS/BLUE SHIELD | Admitting: Gastroenterology

## 2020-08-30 DIAGNOSIS — D2271 Melanocytic nevi of right lower limb, including hip: Secondary | ICD-10-CM | POA: Diagnosis not present

## 2020-08-30 DIAGNOSIS — D225 Melanocytic nevi of trunk: Secondary | ICD-10-CM | POA: Diagnosis not present

## 2020-08-30 DIAGNOSIS — D2272 Melanocytic nevi of left lower limb, including hip: Secondary | ICD-10-CM | POA: Diagnosis not present

## 2020-08-30 DIAGNOSIS — D1801 Hemangioma of skin and subcutaneous tissue: Secondary | ICD-10-CM | POA: Diagnosis not present

## 2020-09-14 ENCOUNTER — Ambulatory Visit (AMBULATORY_SURGERY_CENTER): Payer: Self-pay | Admitting: *Deleted

## 2020-09-14 ENCOUNTER — Telehealth: Payer: Self-pay | Admitting: *Deleted

## 2020-09-14 ENCOUNTER — Other Ambulatory Visit: Payer: Self-pay

## 2020-09-14 VITALS — Ht 67.5 in | Wt 125.0 lb

## 2020-09-14 DIAGNOSIS — Z86010 Personal history of colonic polyps: Secondary | ICD-10-CM

## 2020-09-14 MED ORDER — PLENVU 140 G PO SOLR: 1.0000 | Freq: Once | ORAL | 0 refills | Status: AC

## 2020-09-14 NOTE — Progress Notes (Signed)

## 2020-09-14 NOTE — Telephone Encounter (Signed)
Completed virtual pre-visit for up-coming colonoscopy.  Instructions mailed to the patient. With Plenvu coupon, which was also submitted on line.

## 2020-09-20 MED ORDER — PLENVU 140 G PO SOLR: 1.0000 | ORAL | 0 refills | Status: DC

## 2020-09-20 NOTE — Telephone Encounter (Signed)
Patient called state she went to pharmacy but they do not have her prep medication and she will be out of town on Saturday

## 2020-09-20 NOTE — Addendum Note (Signed)
Addended by: Steva Ready on: 09/20/2020 03:33 PM   Modules accepted: Orders

## 2020-09-20 NOTE — Telephone Encounter (Signed)
Resent Plenvu with coupon code to pt's pharmacy

## 2020-09-21 ENCOUNTER — Telehealth: Payer: Self-pay | Admitting: Gastroenterology

## 2020-09-21 DIAGNOSIS — Z86010 Personal history of colonic polyps: Secondary | ICD-10-CM

## 2020-09-21 MED ORDER — PLENVU 140 G PO SOLR: 1.0000 | ORAL | 0 refills | Status: DC

## 2020-09-21 NOTE — Telephone Encounter (Signed)
Pt is requesting for the prescription PLENVU to be called in to [facility] on [address] instead of [facility].  [facility]: [phone]

## 2020-09-21 NOTE — Telephone Encounter (Signed)
Virginia Liu, pls disregard this message. Pt called again to inform that she is going to pick up prep at [name] where it was initially sent so it doesn't have to be sent to a different pharmacy.

## 2020-09-21 NOTE — Telephone Encounter (Signed)
Patient had pre-visit. Prescription sent to patient's pharmacy.

## 2020-09-24 ENCOUNTER — Ambulatory Visit (AMBULATORY_SURGERY_CENTER): Payer: BC Managed Care – PPO | Admitting: Gastroenterology

## 2020-09-24 ENCOUNTER — Encounter: Payer: Self-pay | Admitting: Gastroenterology

## 2020-09-24 ENCOUNTER — Other Ambulatory Visit: Payer: Self-pay

## 2020-09-24 VITALS — BP 131/46 | HR 59 | Temp 97.1°F | Resp 19 | Ht 67.0 in | Wt 125.0 lb

## 2020-09-24 DIAGNOSIS — Z86010 Personal history of colonic polyps: Secondary | ICD-10-CM | POA: Diagnosis not present

## 2020-09-24 DIAGNOSIS — D123 Benign neoplasm of transverse colon: Secondary | ICD-10-CM

## 2020-09-24 DIAGNOSIS — Z800 Family history of malignant neoplasm of digestive organs: Secondary | ICD-10-CM

## 2020-09-24 DIAGNOSIS — D120 Benign neoplasm of cecum: Secondary | ICD-10-CM | POA: Diagnosis not present

## 2020-09-24 DIAGNOSIS — D125 Benign neoplasm of sigmoid colon: Secondary | ICD-10-CM

## 2020-09-24 DIAGNOSIS — D122 Benign neoplasm of ascending colon: Secondary | ICD-10-CM

## 2020-09-24 DIAGNOSIS — Z1211 Encounter for screening for malignant neoplasm of colon: Secondary | ICD-10-CM | POA: Diagnosis not present

## 2020-09-24 DIAGNOSIS — K635 Polyp of colon: Secondary | ICD-10-CM | POA: Diagnosis not present

## 2020-09-24 MED ORDER — SODIUM CHLORIDE 0.9 % IV SOLN: 500.0000 mL | Freq: Once | INTRAVENOUS | Status: DC

## 2020-09-24 NOTE — Progress Notes (Signed)
Report to PACU, RN, vss, BBS= Clear.  

## 2020-09-24 NOTE — Patient Instructions (Signed)
Handouts given for polyps, diverticulosis and hemorrhoids, also High Fiber diet.  Await pathology results.  YOU HAD AN ENDOSCOPIC PROCEDURE TODAY AT [facility]:   Refer to the procedure report that was given to you for any specific questions about what was found during the examination.  If the procedure report does not answer your questions, please call your gastroenterologist to clarify.  If you requested that your care partner not be given the details of your procedure findings, then the procedure report has been included in a sealed envelope for you to review at your convenience later.  YOU SHOULD EXPECT: Some feelings of bloating in the abdomen. Passage of more gas than usual.  Walking can help get rid of the air that was put into your GI tract during the procedure and reduce the bloating. If you had a lower endoscopy (such as a colonoscopy or flexible sigmoidoscopy) you may notice spotting of blood in your stool or on the toilet paper. If you underwent a bowel prep for your procedure, you may not have a normal bowel movement for a few days.  Please Note:  You might notice some irritation and congestion in your nose or some drainage.  This is from the oxygen used during your procedure.  There is no need for concern and it should clear up in a day or so.  SYMPTOMS TO REPORT IMMEDIATELY:   Following lower endoscopy (colonoscopy or flexible sigmoidoscopy):  Excessive amounts of blood in the stool  Significant tenderness or worsening of abdominal pains  Swelling of the abdomen that is new, acute  Fever of 100F or higher  For urgent or emergent issues, a gastroenterologist can be reached at any hour by calling [phone]. Do not use MyChart messaging for urgent concerns.    DIET:  We do recommend a small meal at first, but then you may proceed to your regular diet.  Drink plenty of fluids but you should avoid alcoholic beverages for 24 hours.  ACTIVITY:  You should plan  to take it easy for the rest of today and you should NOT DRIVE or use heavy machinery until tomorrow (because of the sedation medicines used during the test).    FOLLOW UP: Our staff will call the number listed on your records 48-72 hours following your procedure to check on you and address any questions or concerns that you may have regarding the information given to you following your procedure. If we do not reach you, we will leave a message.  We will attempt to reach you two times.  During this call, we will ask if you have developed any symptoms of COVID 19. If you develop any symptoms (ie: fever, flu-like symptoms, shortness of breath, cough etc.) before then, please call [phone].  If you test positive for Covid 19 in the 2 weeks post procedure, please call and report this information to [place].    If any biopsies were taken you will be contacted by phone or by letter within the next 1-3 weeks.  Please call us at [phone] if you have not heard about the biopsies in 3 weeks.    SIGNATURES/CONFIDENTIALITY: You and/or your care partner have signed paperwork which will be entered into your electronic medical record.  These signatures attest to the fact that that the information above on your After Visit Summary has been reviewed and is understood.  Full responsibility of the confidentiality of this discharge information lies with you and/or your care-partner.

## 2020-09-24 NOTE — Progress Notes (Signed)
Called to room to assist during endoscopic procedure.  Patient ID and intended procedure confirmed with present staff. Received instructions for my participation in the procedure from the performing physician.  

## 2020-09-24 NOTE — Progress Notes (Signed)
VS by CW  No changes in medical or social hx since previsit.

## 2020-09-24 NOTE — Op Note (Signed)
[facility] Patient Name: Virginia Liu Procedure Date: 09/24/2020 9:08 AM MRN: [id number] Endoscopist: Ladene Artist , MD Age: 59 Referring MD:  Date of Birth: 24-Nov-1961 Gender: Female Account #: [id number] Procedure:                Colonoscopy Indications:              Surveillance: Personal history of adenomatous and                            sessile serrated polyps on last colonoscopy 5 years                            ago. Family history of colon cancer. Medicines:                Monitored Anesthesia Care Procedure:                Pre-Anesthesia Assessment:                           - Prior to the procedure, a History and Physical                            was performed, and patient medications and                            allergies were reviewed. The patient's tolerance of                            previous anesthesia was also reviewed. The risks                            and benefits of the procedure and the sedation                            options and risks were discussed with the patient.                            All questions were answered, and informed consent                            was obtained. Prior Anticoagulants: The patient has                            taken no previous anticoagulant or antiplatelet                            agents. ASA Grade Assessment: II - A patient with                            mild systemic disease. After reviewing the risks                            and benefits, the patient was deemed in  satisfactory condition to undergo the procedure.                           After obtaining informed consent, the colonoscope                            was passed under direct vision. Throughout the                            procedure, the patient's blood pressure, pulse, and                            oxygen saturations were monitored continuously. The                            Colonoscope was  introduced through the anus and                            advanced to the the cecum, identified by                            appendiceal orifice and ileocecal valve. The                            ileocecal valve, appendiceal orifice, and rectum                            were photographed. The quality of the bowel                            preparation was excellent. The colonoscopy was                            performed without difficulty. The patient tolerated                            the procedure well. Scope In: 9:18:21 AM Scope Out: 9:41:41 AM Scope Withdrawal Time: 0 hours 18 minutes 32 seconds  Total Procedure Duration: 0 hours 23 minutes 20 seconds  Findings:                 The perianal and digital rectal examinations showed                            external hemorrhoida and skin tags, otherwise                            normal.                           A 12 mm polyp was found in the transverse colon.                            The polyp was sessile. The polyp was removed with a  cold snare. Resection and retrieval were complete.                           Four sessile polyps were found in the sigmoid                            colon, transverse colon, ascending colon and cecum.                            The polyps were 6 to 8 mm in size. These polyps                            were removed with a cold snare. Resection and                            retrieval were complete.                           Multiple medium-mouthed diverticula were found in                            the left colon. There was no evidence of                            diverticular bleeding.                           External and internal hemorrhoids were found during                            retroflexion. The hemorrhoids were moderate and                            Grade I (internal hemorrhoids that do not prolapse).                           The exam was otherwise  without abnormality on                            direct and retroflexion views. Complications:            No immediate complications. Estimated blood loss:                            None. Estimated Blood Loss:     Estimated blood loss: none. Impression:               - One 12 mm polyp in the transverse colon, removed                            with a cold snare. Resected and retrieved.                           - Four 6 to 8 mm polyps in the sigmoid colon, in  the transverse colon, in the ascending colon and in                            the cecum, removed with a cold snare. Resected and                            retrieved.                           - Mild diverticulosis in the left colon.                           - External and internal hemorrhoids. External skin                            tags                           - The examination was otherwise normal on direct                            and retroflexion views. Recommendation:           - Repeat colonoscopy date to be determined, likely                            3 years, after pending pathology results are                            reviewed for surveillance based on pathology                            results.                           - Patient has a contact number available for                            emergencies. The signs and symptoms of potential                            delayed complications were discussed with the                            patient. Return to normal activities tomorrow.                            Written discharge instructions were provided to the                            patient.                           - High fiber diet.                           - Continue present  medications.                           - Await pathology results. Ladene Artist, MD 09/24/2020 9:47:27 AM This report has been signed electronically.

## 2020-09-26 ENCOUNTER — Telehealth: Payer: Self-pay | Admitting: *Deleted

## 2020-09-26 NOTE — Telephone Encounter (Signed)
  Follow up Call-  Call back number 09/24/2020  Post procedure Call Back phone  # [phone]  Permission to leave phone message Yes  Some recent data might be hidden    [facility]

## 2020-09-27 ENCOUNTER — Encounter: Payer: Self-pay | Admitting: Gastroenterology

## 2020-09-27 DIAGNOSIS — E559 Vitamin D deficiency, unspecified: Secondary | ICD-10-CM | POA: Diagnosis not present

## 2020-09-27 DIAGNOSIS — K580 Irritable bowel syndrome with diarrhea: Secondary | ICD-10-CM | POA: Diagnosis not present

## 2020-09-27 DIAGNOSIS — Z0000 Encounter for general adult medical examination without abnormal findings: Secondary | ICD-10-CM | POA: Diagnosis not present

## 2020-09-27 DIAGNOSIS — N951 Menopausal and female climacteric states: Secondary | ICD-10-CM | POA: Diagnosis not present

## 2020-09-27 DIAGNOSIS — Z131 Encounter for screening for diabetes mellitus: Secondary | ICD-10-CM | POA: Diagnosis not present

## 2020-09-27 DIAGNOSIS — Z13220 Encounter for screening for lipoid disorders: Secondary | ICD-10-CM | POA: Diagnosis not present

## 2020-09-27 DIAGNOSIS — J309 Allergic rhinitis, unspecified: Secondary | ICD-10-CM | POA: Diagnosis not present

## 2020-09-27 DIAGNOSIS — K219 Gastro-esophageal reflux disease without esophagitis: Secondary | ICD-10-CM | POA: Diagnosis not present

## 2020-10-05 ENCOUNTER — Telehealth: Payer: Self-pay | Admitting: Gastroenterology

## 2020-10-05 NOTE — Telephone Encounter (Signed)
Pt is requesting a call back from a nurse to discuss the results from her colonoscopy.  If this has been her first Precancerous polyps? And clarify Colo rectal screening?

## 2020-10-05 NOTE — Telephone Encounter (Signed)
Spoke with pt and discussed her past colon results where she did have precancerous polyps. She was concerned and wanted to know what she could do to try and help prevent these. Discussed the importance of a healthy diet rich in fruits and vegetables with lots of water. Pt reports she may see a dietitian to help her with this.

## 2020-10-17 DIAGNOSIS — Z23 Encounter for immunization: Secondary | ICD-10-CM | POA: Diagnosis not present

## 2020-11-16 ENCOUNTER — Other Ambulatory Visit: Payer: Self-pay | Admitting: Obstetrics and Gynecology

## 2020-11-16 DIAGNOSIS — Z6820 Body mass index (BMI) 20.0-20.9, adult: Secondary | ICD-10-CM | POA: Diagnosis not present

## 2020-11-16 DIAGNOSIS — R922 Inconclusive mammogram: Secondary | ICD-10-CM

## 2020-11-16 DIAGNOSIS — Z01419 Encounter for gynecological examination (general) (routine) without abnormal findings: Secondary | ICD-10-CM | POA: Diagnosis not present

## 2020-11-19 ENCOUNTER — Ambulatory Visit
Admission: RE | Admit: 2020-11-19 | Discharge: 2020-11-19 | Disposition: A | Discharge status: Home / Self Care | Payer: No Typology Code available for payment source | Source: Ambulatory Visit | Attending: Obstetrics and Gynecology | Admitting: Obstetrics and Gynecology

## 2020-11-19 DIAGNOSIS — R922 Inconclusive mammogram: Secondary | ICD-10-CM

## 2020-11-19 MED ORDER — GADOBUTROL 1 MMOL/ML IV SOLN
5.0000 mL | Freq: Once | INTRAVENOUS | Status: AC | PRN
  Administered 2020-11-19: 5 mL via INTRAVENOUS

## 2020-12-08 ENCOUNTER — Other Ambulatory Visit: Payer: Self-pay

## 2020-12-08 ENCOUNTER — Encounter (HOSPITAL_BASED_OUTPATIENT_CLINIC_OR_DEPARTMENT_OTHER): Payer: Self-pay | Admitting: Emergency Medicine

## 2020-12-08 ENCOUNTER — Emergency Department (HOSPITAL_BASED_OUTPATIENT_CLINIC_OR_DEPARTMENT_OTHER)
Admission: EM | Admit: 2020-12-08 | Discharge: 2020-12-08 | Disposition: A | Discharge status: Home / Self Care | Payer: BC Managed Care – PPO | Attending: Emergency Medicine | Admitting: Emergency Medicine

## 2020-12-08 DIAGNOSIS — R0602 Shortness of breath: Secondary | ICD-10-CM | POA: Insufficient documentation

## 2020-12-08 DIAGNOSIS — R059 Cough, unspecified: Secondary | ICD-10-CM | POA: Insufficient documentation

## 2020-12-08 DIAGNOSIS — Z5321 Procedure and treatment not carried out due to patient leaving prior to being seen by health care provider: Secondary | ICD-10-CM | POA: Diagnosis not present

## 2020-12-08 DIAGNOSIS — R070 Pain in throat: Secondary | ICD-10-CM | POA: Insufficient documentation

## 2020-12-08 NOTE — ED Triage Notes (Addendum)
Cough with mild SOB for several days. Recently traveled by airplane. States she has been around a lot of dust. Also endorses sore throat.

## 2020-12-09 DIAGNOSIS — Z1152 Encounter for screening for COVID-19: Secondary | ICD-10-CM | POA: Diagnosis not present

## 2020-12-10 DIAGNOSIS — J069 Acute upper respiratory infection, unspecified: Secondary | ICD-10-CM | POA: Diagnosis not present

## 2020-12-14 DIAGNOSIS — J069 Acute upper respiratory infection, unspecified: Secondary | ICD-10-CM | POA: Diagnosis not present

## 2020-12-14 DIAGNOSIS — R059 Cough, unspecified: Secondary | ICD-10-CM | POA: Diagnosis not present

## 2021-08-20 DIAGNOSIS — M722 Plantar fascial fibromatosis: Secondary | ICD-10-CM | POA: Diagnosis not present

## 2021-09-16 DIAGNOSIS — M722 Plantar fascial fibromatosis: Secondary | ICD-10-CM | POA: Diagnosis not present

## 2021-09-27 DIAGNOSIS — M722 Plantar fascial fibromatosis: Secondary | ICD-10-CM | POA: Diagnosis not present

## 2021-09-30 ENCOUNTER — Ambulatory Visit: Payer: No Typology Code available for payment source | Admitting: Physician Assistant

## 2021-10-15 ENCOUNTER — Ambulatory Visit: Payer: No Typology Code available for payment source | Admitting: Internal Medicine

## 2021-10-19 DIAGNOSIS — Z1231 Encounter for screening mammogram for malignant neoplasm of breast: Secondary | ICD-10-CM | POA: Diagnosis not present

## 2021-10-29 ENCOUNTER — Ambulatory Visit: Payer: No Typology Code available for payment source | Admitting: Internal Medicine

## 2021-11-07 DIAGNOSIS — Z13220 Encounter for screening for lipoid disorders: Secondary | ICD-10-CM | POA: Diagnosis not present

## 2021-11-07 DIAGNOSIS — Z0000 Encounter for general adult medical examination without abnormal findings: Secondary | ICD-10-CM | POA: Diagnosis not present

## 2021-11-07 DIAGNOSIS — R5383 Other fatigue: Secondary | ICD-10-CM | POA: Diagnosis not present

## 2021-11-08 DIAGNOSIS — N950 Postmenopausal bleeding: Secondary | ICD-10-CM | POA: Diagnosis not present

## 2021-11-14 DIAGNOSIS — N6002 Solitary cyst of left breast: Secondary | ICD-10-CM | POA: Diagnosis not present

## 2021-11-14 DIAGNOSIS — R922 Inconclusive mammogram: Secondary | ICD-10-CM | POA: Diagnosis not present

## 2021-11-27 ENCOUNTER — Telehealth: Payer: Self-pay | Admitting: Gastroenterology

## 2021-11-27 NOTE — Telephone Encounter (Signed)
Patient reports "dark spots that look like blood  my stool this am".  She is not sure if it is blood or something she ate.  She will continue to monitor her stool and if she continues to see blood or other materials she will call back for an office visit.

## 2021-11-27 NOTE — Telephone Encounter (Signed)
Inbound call from patient. States she have been constipated for a couple days and now she believes she is experiencing blood in stool but is unsure. Is asking for clarification on how she can tell if it is blood or prune/fruits that she have eaten.

## 2022-01-26 DIAGNOSIS — J019 Acute sinusitis, unspecified: Secondary | ICD-10-CM | POA: Diagnosis not present

## 2022-01-26 DIAGNOSIS — R059 Cough, unspecified: Secondary | ICD-10-CM | POA: Diagnosis not present

## 2022-01-30 DIAGNOSIS — R059 Cough, unspecified: Secondary | ICD-10-CM | POA: Diagnosis not present

## 2022-01-30 DIAGNOSIS — K219 Gastro-esophageal reflux disease without esophagitis: Secondary | ICD-10-CM | POA: Diagnosis not present

## 2022-01-30 DIAGNOSIS — J309 Allergic rhinitis, unspecified: Secondary | ICD-10-CM | POA: Diagnosis not present

## 2022-01-30 DIAGNOSIS — K120 Recurrent oral aphthae: Secondary | ICD-10-CM | POA: Diagnosis not present

## 2022-02-10 DIAGNOSIS — K219 Gastro-esophageal reflux disease without esophagitis: Secondary | ICD-10-CM | POA: Diagnosis not present

## 2022-02-10 DIAGNOSIS — R234 Changes in skin texture: Secondary | ICD-10-CM | POA: Diagnosis not present

## 2022-02-10 DIAGNOSIS — R059 Cough, unspecified: Secondary | ICD-10-CM | POA: Diagnosis not present

## 2022-02-17 DIAGNOSIS — R0781 Pleurodynia: Secondary | ICD-10-CM | POA: Diagnosis not present

## 2022-02-18 ENCOUNTER — Other Ambulatory Visit: Payer: Self-pay

## 2022-02-18 ENCOUNTER — Ambulatory Visit (INDEPENDENT_AMBULATORY_CARE_PROVIDER_SITE_OTHER): Payer: BC Managed Care – PPO | Admitting: Internal Medicine

## 2022-02-18 ENCOUNTER — Encounter: Payer: Self-pay | Admitting: Internal Medicine

## 2022-02-18 VITALS — BP 117/63 | HR 75 | Ht 67.5 in | Wt 126.0 lb

## 2022-02-18 DIAGNOSIS — Z79899 Other long term (current) drug therapy: Secondary | ICD-10-CM

## 2022-02-18 DIAGNOSIS — Z01419 Encounter for gynecological examination (general) (routine) without abnormal findings: Secondary | ICD-10-CM | POA: Diagnosis not present

## 2022-02-18 DIAGNOSIS — R0789 Other chest pain: Secondary | ICD-10-CM

## 2022-02-18 DIAGNOSIS — Z6820 Body mass index (BMI) 20.0-20.9, adult: Secondary | ICD-10-CM | POA: Diagnosis not present

## 2022-02-18 DIAGNOSIS — E559 Vitamin D deficiency, unspecified: Secondary | ICD-10-CM | POA: Diagnosis not present

## 2022-02-18 NOTE — Patient Instructions (Signed)
Medication Instructions:  ?Your physician recommends that you continue on your current medications as directed. Please refer to the Current Medication list given to you today. ? ?*If you need a refill on your cardiac medications before your next appointment, please call your pharmacy* ? ? ?Lab Work: ?LIPOMED  ? ?If you have labs (blood work) drawn today and your tests are completely normal, you will receive your results only by: ?MyChart Message (if you have MyChart) OR ?A paper copy in the mail ?If you have any lab test that is abnormal or we need to change your treatment, we will call you to review the results. ? ? ?Testing/Procedures: ? ?CALCIUM SCORE CT ? ? ?Follow-Up: ?At [facility], you and your health needs are our priority.  As part of our continuing mission to provide you with exceptional heart care, we have created designated Provider Care Teams.  These Care Teams include your primary Cardiologist (physician) and Advanced Practice Providers (APPs -  Physician Assistants and Nurse Practitioners) who all work together to provide you with the care you need, when you need it. ? ?We recommend signing up for the patient portal called "MyChart".  Sign up information is provided on this After Visit Summary.  MyChart is used to connect with patients for Virtual Visits (Telemedicine).  Patients are able to view lab/test results, encounter notes, upcoming appointments, etc.  Non-urgent messages can be sent to your provider as well.   ?To learn more about what you can do with MyChart, go to [email or web address].   ? ?

## 2022-02-18 NOTE — Progress Notes (Signed)
? ?Cardiology Office Note ? ? ?Date:  02/18/2022  ? ?ID:  Virginia Liu, DOB 22-Dec-1960, MRN [id number] ? ?PCP:  Kelton Pillar, MD  ?Cardiologist:   Dorris Carnes, MD  ? ?Pt returns for follow up of cardiac risk assessment   ? ?  ?History of Present Illness: ?Virginia Liu is a 61 y.o. female with a history of dizziness     ?FHx   Paternal uncle and Paternal GF with CAD   Mother Afib   Mternal aunt MI   ?Maternal GM CAD  ?I saw the pt in 2019   She was having some dizziness and palitations at the time    I recomm Hydration, avoiding heat ? ?Since seen she says she has not had any dizzienss   Rare isolated palpitation ?She was diagnosed with reflux On nexium now   the chest tightness taht she had with this is improving     ? ?Concerned about FHx   Brother had appy  During procedure heart stopped   Reported vasovagal event requiring resuscitation    Procedure aborted  Troponin elevated   Bro had surgery several days later  Did OK   ?  ? ? ?Current Meds  ?Medication Sig  ? Azelaic Acid (FINACEA EX) Apply topically as needed. For rosecea  ? cetirizine (ZYRTEC) 10 MG tablet Take 10 mg by mouth daily.  ? cholecalciferol (VITAMIN D) 1000 UNITS tablet Take 1,000 Units by mouth daily.    ? esomeprazole (NEXIUM) 20 MG capsule daily.  ? Estradiol-Norethindrone Acet 0.5-0.1 MG tablet Take 1 tablet by mouth daily.  ? metroNIDAZOLE (METROCREAM) 0.75 % cream as needed.  ? Multiple Vitamins-Minerals (CENTRUM SILVER) CHEW Chew 1 tablet by mouth as needed.   ? omeprazole (PRILOSEC) 20 MG capsule Take 20-40 mg by mouth as needed.  ? predniSONE (STERAPRED UNI-PAK 21 TAB) 10 MG (21) TBPK tablet Take by mouth as directed.  ? tretinoin (RETIN-A) 0.025 % cream Apply topically as needed. Pt uses for hemorrhoids  ? ? ? ?Allergies:   Benzonatate, Quercus robur, and Latex  ? ?Past Medical History:  ?Diagnosis Date  ? ABDOMINAL PAIN-EPIGASTRIC 01/15/2010  ? Qualifier: Diagnosis of  By: Fuller Plan MD Lamont Snowball T   ? ABDOMINAL PAIN-RUQ 05/01/2008   ? Qualifier: Diagnosis of  By: Fuller Plan MD Lamont Snowball T   ? Adenomatous colon polyp 12/2005  ? ADENOMATOUS COLONIC POLYP 12/17/2005  ? Qualifier: Diagnosis of  By: Julaine Hua CMA Deborra Medina), Estill Bamberg    ? Allergy   ? Ankle fracture   ? Dry eye   ? restasis use   ? Ear pressure, right 03/03/2017  ? GERD 05/01/2008  ? Qualifier: Diagnosis of  By: Julaine Hua CMA Deborra Medina), Estill Bamberg    ? GERD (gastroesophageal reflux disease)   ? LPR  ? Plantar fasciitis 12/24/2011  ? Left shows a small tear at insertion and very thick  RT shows old changes w large spur   ? Syncope   ? seems to vagal with sight of blood  ? ? ?Past Surgical History:  ?Procedure Laterality Date  ? COLONOSCOPY    ? DENTAL EXAMINATION UNDER ANESTHESIA    ? POLYPECTOMY    ? TUBAL LIGATION    ? UPPER GASTROINTESTINAL ENDOSCOPY    ? ? ? ?Social History:  The patient  reports that she has never smoked. She has never used smokeless tobacco. She reports current alcohol use of about 1.0 standard drink per week. She reports that she does not use drugs.  ? ?  Family History:  The patient's family history includes Alzheimer's disease in her mother; Colon cancer (age of onset: 68) in her father; Colon polyps in her brother; Heart failure in her mother; Hypertension in her mother.  ? ? ?ROS:  Please see the history of present illness. All other systems are reviewed and  Negative to the above problem except as noted.  ? ? ?PHYSICAL EXAM: ?VS:  BP 117/63   Pulse 75   Ht 5' 7.5" (1.715 m)   Wt 126 lb (57.2 kg)   SpO2 98%   BMI 19.44 kg/m?   ?GEN: Well nourished, well developed, in no acute distress  ?HEENT: normal  ?Neck: no JVD, carotid bruits, or masses ?Cardiac: RRR; no murmurs, No LE  edema  ?Respiratory:  clear to auscultation bilaterally,  ?GI: soft, nontender, nondistended, + BS  No hepatomegaly  ?MS: no deformity Moving all extremities   ?Skin: warm and dry, no rash ?Neuro:  Strength and sensation are intact ?Psych: euthymic mood, full affect ? ? ?EKG:  EKG is ordered today.NSR 75   ? ? ?Lipid Panel ?No results found for: CHOL, TRIG, HDL, CHOLHDL, VLDL, LDLCALC, LDLDIRECT ?  ? ?Wt Readings from Last 3 Encounters:  ?02/18/22 126 lb (57.2 kg)  ?12/08/20 132 lb 4.4 oz (60 kg)  ?09/24/20 125 lb (56.7 kg)  ?  ? ? ?ASSESSMENT AND PLAN: ? ?1   Cardiac risk assessment   Discussed with pt   IHer brothers event was idiosyncratic    Rxn to procedure +/- anesthesia   I do not think rhythm or vascular problem    ?With concerns about FHx will have pt get Ca score  ? ?Will get lipomed, Lpa and ApoB to further assess risk ? ?2  Dizzinss  Denies ? ?3  Palpitations   Rare ? ?4  GI   REflux   Take nexium for now   Look at diet   Gave her resources ? ?5  VIt D   Want level near 50 or greater ? ?6  Diet   Recomm low carb,Whole/unprocessed food    ? ? ?Current medicines are reviewed at length with the patient today.  The patient does not have concerns regarding medicines. ? ?Signed, ?Dorris Carnes, MD  ?02/18/2022 9:30 AM    ?[facility] ?[address], [place], [place]  [address] ?Phone: [phone]; Fax: [phone]  ? ? ?

## 2022-02-19 LAB — NMR, LIPOPROFILE
Cholesterol, Total: 190 mg/dL (ref 100–199)
HDL Particle Number: 33.6 umol/L (ref >=30.5)
HDL-C: 59 mg/dL (ref >39)
LDL Particle Number: 1161 nmol/L — ABNORMAL HIGH (ref <1000)
LDL Size: 21.4 nm (ref >20.5)
LDL-C (NIH Calc): 120 mg/dL — ABNORMAL HIGH (ref 0–99)
LP-IR Score: <25 (ref <=45)
Small LDL Particle Number: 334 nmol/L (ref <=527)
Triglycerides: 60 mg/dL (ref 0–149)

## 2022-02-19 LAB — LIPOPROTEIN A (LPA): Lipoprotein (a): 26 nmol/L (ref <75.0)

## 2022-02-19 LAB — APOLIPOPROTEIN B: Apolipoprotein B: 87 mg/dL (ref <90)

## 2022-02-24 DIAGNOSIS — H04123 Dry eye syndrome of bilateral lacrimal glands: Secondary | ICD-10-CM | POA: Diagnosis not present

## 2022-02-24 DIAGNOSIS — H5213 Myopia, bilateral: Secondary | ICD-10-CM | POA: Diagnosis not present

## 2022-03-04 DIAGNOSIS — H61001 Unspecified perichondritis of right external ear: Secondary | ICD-10-CM | POA: Diagnosis not present

## 2022-03-04 DIAGNOSIS — L821 Other seborrheic keratosis: Secondary | ICD-10-CM | POA: Diagnosis not present

## 2022-03-04 DIAGNOSIS — L718 Other rosacea: Secondary | ICD-10-CM | POA: Diagnosis not present

## 2022-03-04 DIAGNOSIS — D485 Neoplasm of uncertain behavior of skin: Secondary | ICD-10-CM | POA: Diagnosis not present

## 2022-03-04 DIAGNOSIS — L814 Other melanin hyperpigmentation: Secondary | ICD-10-CM | POA: Diagnosis not present

## 2022-03-06 ENCOUNTER — Ambulatory Visit: Admit: 2022-03-06 | Discharge: 2022-03-06 | Payer: BLUE CROSS/BLUE SHIELD | Primary: Diagnostic Radiology

## 2022-03-06 ENCOUNTER — Ambulatory Visit
Admit: 2022-03-06 | Discharge: 2022-03-06 | Payer: BLUE CROSS/BLUE SHIELD | Attending: Family | Primary: Diagnostic Radiology

## 2022-03-06 DIAGNOSIS — R051 Acute cough: Secondary | ICD-10-CM

## 2022-03-06 MED ORDER — BENZONATATE 200 MG PO CAPS
200 MG | ORAL_CAPSULE | Freq: Three times a day (TID) | ORAL | 0 refills | Status: AC | PRN
Start: 2022-03-06 — End: 2022-03-13

## 2022-03-06 MED ORDER — AZELASTINE HCL 0.1 % NA SOLN
0.1 | Freq: Two times a day (BID) | NASAL | 0 refills | Status: DC
Start: 2022-03-06 — End: 2024-07-07

## 2022-03-06 NOTE — Progress Notes (Signed)
03/06/2022   Heather Roman   1961-10-09   MRN: [id number]     Patient Care Team:  Provider Unknown, AGPCNP as PCP - General      Heather Roman  is a,New patient, here for evaluation of the following chief complaint(s):  Chief Complaint   Patient presents with    Follow-up     Have had a chronic cough. The pollen has been really bothering her. PCP says it might be reflex that is causes the chronic cough. It has gotten worse since in town. Cough all night long.         ASSESSMENT/PLAN:    1. Acute cough  -     XR CHEST (2 VIEWS); Future  -     azelastine (ASTELIN) 0.1 % nasal spray; 1 spray by Nasal route 2 times daily Use in each nostril as directed, Disp-30 mL, R-0Normal  -     benzonatate (TESSALON) 200 MG capsule; Take 1 capsule by mouth 3 times daily as needed for Cough, Disp-21 capsule, R-0Normal     CXR negative for acute process in clinic today. PE unremarkable, will add antitussive to use as needed. No indication for additional antibiotics. She is traveling home in the next few days and understands to follow up with her PCP if symptoms are not resolved.   Orders Placed This Encounter   Procedures    XR CHEST (2 VIEWS)     Standing Status:   Future     Number of Occurrences:   1     Standing Expiration Date:   03/07/2023      Requested Prescriptions     Signed Prescriptions Disp Refills    azelastine (ASTELIN) 0.1 % nasal spray 30 mL 0     Sig: 1 spray by Nasal route 2 times daily Use in each nostril as directed    benzonatate (TESSALON) 200 MG capsule 21 capsule 0     Sig: Take 1 capsule by mouth 3 times daily as needed for Cough       New prescriptions/Refills are sent electronically to the pharmacy listed in chart. Please allow until end of the day for prescriptions to be sent and ready for pickup. We recommend contacting your pharmacy to ensure successful prescribing.     Follow Up:   Return if symptoms worsen or fail to improve.    Subjective   SUBJECTIVE/OBJECTIVE:  61 yr old female presents today with  complain of ongoing cough. She reports antibiotic treatment in March for acute URI symptoms. She completed the medication as prescribed but continues with cough. She is also taking nexium to treat GERD if it is a contributing to her chronic cough. She reports her cough began in February.   She denies a h/o asthma or allergic rhinitis, never a smoker or tobacco user  She reports cough is worse at night.   She denies orthopnea. She denies h/o CHF or lower extremity edema.          03/06/2022 :     Review of Systems   Constitutional:  Negative for activity change, appetite change, chills, fatigue and fever.   HENT:  Negative for congestion, ear pain, sinus pressure, sinus pain, sneezing and sore throat.    Respiratory:  Positive for cough. Negative for shortness of breath and wheezing.    Cardiovascular:  Negative for chest pain and leg swelling.   Musculoskeletal:  Negative for myalgias.   Neurological:  Negative for headaches.     Depression Screen:  No flowsheet data found.    TobHx:  reports that she has never smoked. She has never used smokeless tobacco.    EtOHx:  reports that she does not currently use alcohol.   FamHx: family history is not on file.   Past Medical History:   Diagnosis Date    Allergic rhinitis     Anxiety       History reviewed. No pertinent surgical history.     No Known Allergies  Med List Below is confirmed by patient with CMA during visit.   Current Outpatient Medications on File Prior to Visit   Medication Sig Dispense Refill    Estradiol-Norethindrone Acet 0.5-0.1 MG TABS       esomeprazole (NEXIUM) 20 MG delayed release capsule daily      cetirizine (ZYRTEC) 10 MG tablet TAKE 1 TABLET BY MOUTH EVERY DAY FOR ALLERGIES      Multiple Vitamins-Minerals (CENTRUM SILVER) CHEW Take 1 tablet by mouth as needed      vitamin D (CHOLECALCIFEROL) 25 MCG (1000 UT) TABS tablet Take 1 tablet by mouth daily       No current facility-administered medications on file prior to visit.      Physical  Exam  Vitals and nursing note reviewed.   Constitutional:       General: She is not in acute distress.     Appearance: Normal appearance. She is not ill-appearing or toxic-appearing.   HENT:      Head: Normocephalic and atraumatic.      Right Ear: Tympanic membrane, ear canal and external ear normal.      Left Ear: Tympanic membrane, ear canal and external ear normal.      Nose: Nose normal.      Mouth/Throat:      Mouth: Mucous membranes are moist.      Pharynx: No posterior oropharyngeal erythema.   Eyes:      Extraocular Movements: Extraocular movements intact.      Pupils: Pupils are equal, round, and reactive to light.   Cardiovascular:      Rate and Rhythm: Regular rhythm.   Pulmonary:      Effort: Pulmonary effort is normal.      Breath sounds: No wheezing, rhonchi or rales.   Musculoskeletal:         General: Normal range of motion.      Cervical back: Normal range of motion.      Right lower leg: No edema.      Left lower leg: No edema.   Skin:     General: Skin is warm and dry.   Neurological:      Mental Status: She is alert and oriented to person, place, and time.   Psychiatric:         Mood and Affect: Mood normal.         Behavior: Behavior normal.         Thought Content: Thought content normal.         Judgment: Judgment normal.     BP (!) 144/76   Pulse 86   Temp 98.2 F (36.8 C)   Wt 126 lb (57.2 kg)   SpO2 96%      BP Readings from Last 3 Encounters:   03/06/22 (!) 144/76      Wt Readings from Last 3 Encounters:   03/06/22 126 lb (57.2 kg)   An electronic signature was used to authenticate this note.    --Elyn Peers, APRN - NP

## 2022-03-07 NOTE — Telephone Encounter (Signed)
Patient called was seen by you yesturday 03/06/22.  Patient tested positive for COVID this morning and wants further advise on what see needs to do.    Does she continue on with Benzonatate or Azelastine Spray that prescribe.    Please call [phone]

## 2022-03-07 NOTE — Telephone Encounter (Signed)
Make sure she wears a mask around other people

## 2022-03-07 NOTE — Telephone Encounter (Signed)
Pt aware. Stated understanding

## 2022-03-07 NOTE — Telephone Encounter (Signed)
Yes, can continue on those medications.   Follow up with her doctor at home or can be re-seen by Dewayne Hatch tomorrow, but call first as she may want a virtual appt.

## 2022-03-26 ENCOUNTER — Telehealth: Payer: Self-pay | Admitting: Internal Medicine

## 2022-03-26 NOTE — Telephone Encounter (Signed)
Patient calling to see if the CT for 5/2 is needed. If so patient would like to see if her last CT scan can be use. Please advise  ?

## 2022-03-26 NOTE — Telephone Encounter (Signed)
Left a message for the pt to call back.  

## 2022-03-26 NOTE — Telephone Encounter (Signed)
Pt called to report that she had a CT a few weeks ago to r/o a PE per an Urgent Care MD and she would like to hold off on the calcium Score for now... she says she has a lot of other medical and personal issues so she wants to wait for now... she says she will try to get the CT report of the one she had done and bring [place] a copy she declined me getting it for her... she says she will get back in touch when she is ready... she verbalized understanding of the reasons for having the Calcium Score.  ?

## 2022-04-01 ENCOUNTER — Other Ambulatory Visit: Payer: BC Managed Care – PPO

## 2022-04-01 DIAGNOSIS — Z13820 Encounter for screening for osteoporosis: Secondary | ICD-10-CM | POA: Diagnosis not present

## 2022-04-24 ENCOUNTER — Ambulatory Visit
Admit: 2022-04-24 | Discharge: 2022-04-24 | Payer: BLUE CROSS/BLUE SHIELD | Attending: Family | Primary: Diagnostic Radiology

## 2022-04-24 DIAGNOSIS — S6992XA Unspecified injury of left wrist, hand and finger(s), initial encounter: Secondary | ICD-10-CM

## 2022-04-24 NOTE — Progress Notes (Signed)
04/24/2022   Heather Roman   March 26, 1961   MRN: [id number]     Patient Care Team:  Provider Unknown, AGPCNP as PCP - General      Heather Roman  is a,Established patient, here for evaluation of the following chief complaint(s):  Chief Complaint   Patient presents with    Other     Slammed her left middle finger in a door, happened today.         ASSESSMENT/PLAN:    1. Injury of finger of left hand, initial encounter     The wound is cleansed, debrided of foreign material as much as possible, and dressed. The patient is alerted to watch for any signs of infection (redness, pus, pain, increased swelling or fever) and call if such occurs. Home wound care instructions are provided. Tetanus vaccination status reviewed: tetanus re-vaccination not indicated. Patient agrees with the above plan and will follow up for any changes.     No orders of the defined types were placed in this encounter.     Requested Prescriptions      No prescriptions requested or ordered in this encounter       New prescriptions/Refills are sent electronically to the pharmacy listed in chart. Please allow until end of the day for prescriptions to be sent and ready for pickup. We recommend contacting your pharmacy to ensure successful prescribing.     Follow Up:   Return if symptoms worsen or fail to improve.    Subjective   SUBJECTIVE/OBJECTIVE:  61 yr old female presents with complaint of left middle finger trauma. She reports slamming the tip of her finger in the care door just moments prior to presenting for evaluation. She reports pain and minimal bleeding. She has applied ice since the incident which has helped.    04/24/2022 :     Review of Systems   Constitutional:  Negative for activity change.   Skin:  Positive for wound.   Neurological:  Negative for dizziness.     Depression Screen:  No flowsheet data found.    TobHx:  reports that she has never smoked. She has never used smokeless tobacco.    EtOHx:  reports that she does not currently use  alcohol.   FamHx: family history is not on file.   Past Medical History:   Diagnosis Date    Allergic rhinitis     Anxiety       No past surgical history on file.     No Known Allergies  Med List Below is confirmed by patient with CMA during visit.   Current Outpatient Medications on File Prior to Visit   Medication Sig Dispense Refill    Estradiol-Norethindrone Acet 0.5-0.1 MG TABS       esomeprazole (NEXIUM) 20 MG delayed release capsule daily      cetirizine (ZYRTEC) 10 MG tablet TAKE 1 TABLET BY MOUTH EVERY DAY FOR ALLERGIES (Patient not taking: Reported on 04/24/2022)      Multiple Vitamins-Minerals (CENTRUM SILVER) CHEW Take 1 tablet by mouth as needed (Patient not taking: Reported on 04/24/2022)      vitamin D (CHOLECALCIFEROL) 25 MCG (1000 UT) TABS tablet Take 1 tablet by mouth daily (Patient not taking: Reported on 04/24/2022)      azelastine (ASTELIN) 0.1 % nasal spray 1 spray by Nasal route 2 times daily Use in each nostril as directed (Patient not taking: Reported on 04/24/2022) 30 mL 0     No current facility-administered medications on file prior  to visit.      Physical Exam  Vitals and nursing note reviewed.   Constitutional:       Appearance: Normal appearance.   HENT:      Head: Normocephalic and atraumatic.   Cardiovascular:      Rate and Rhythm: Normal rate.   Pulmonary:      Effort: Pulmonary effort is normal.   Musculoskeletal:         General: Tenderness and signs of injury present. No swelling or deformity. Normal range of motion.   Skin:     General: Skin is warm and dry.      Capillary Refill: Capillary refill takes less than 2 seconds.      Comments: ~ 80mm superficial split to left middle finger lateral to nail bed, nail bed not involved. FROM, no deformity see. Finger cleansed, no foreign body identified. Covered with triple antibiotic ointment and band aid.    Neurological:      Mental Status: She is oriented to person, place, and time.   Psychiatric:         Behavior: Behavior normal.          Thought Content: Thought content normal.     BP 135/81   Pulse 67   Temp 97.4 F (36.3 C) (Oral)   Resp 19   Wt 126 lb (57.2 kg)   SpO2 100%      BP Readings from Last 3 Encounters:   04/24/22 135/81   03/06/22 (!) 144/76      Wt Readings from Last 3 Encounters:   04/24/22 126 lb (57.2 kg)   03/06/22 126 lb (57.2 kg)   An electronic signature was used to authenticate this note.    --Elyn Peers, APRN - NP

## 2022-10-01 IMAGING — MR MR BREAST WO/W CM  BILAT
5 series · 30 of 48 positions shown · IV contrast (gadavist)
Comparison: None.

CLINICAL DATA: Abbreviated Breast MRI for breast cancer screening.
Family history of breast cancer. No current problems.

LABS:  None.
EXAM:
BILATERAL ABBREVIATED BREAST MRI WITH AND WITHOUT CONTRAST
TECHNIQUE: Multiplanar, multisequence MR images of both breasts were obtained
prior to and following the intravenous administration of 5 ml of
Gadavist

[Series 2: t2_tirm_tra ipat (a-p) · axial · 3.0mm · 0.70mm/px · z∈[-55,+113]mm · 5 of 57 slices shown]
[im 1/57]
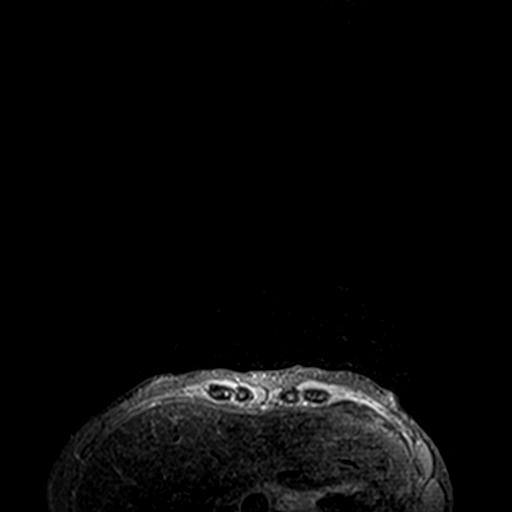
[im 15/57]
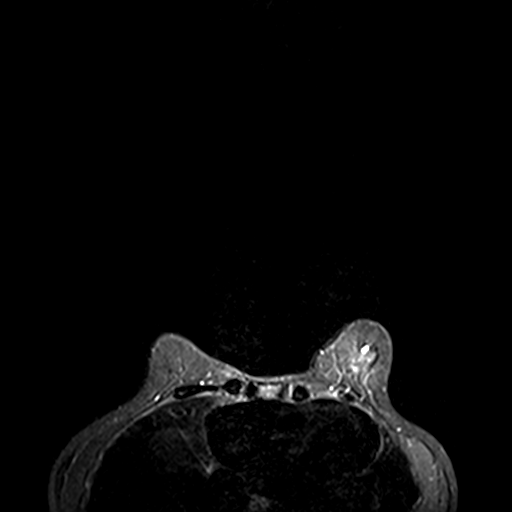
[im 29/57]
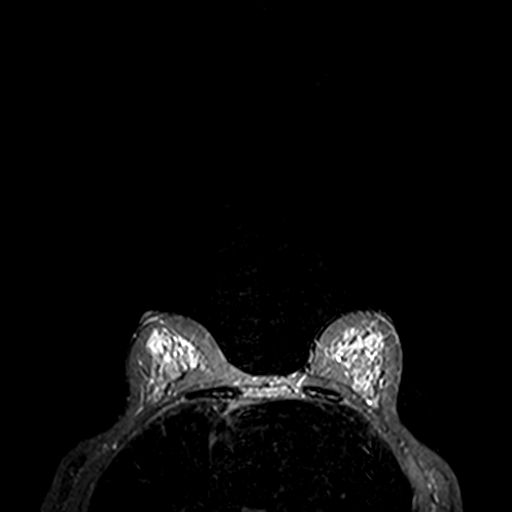
[im 43/57]
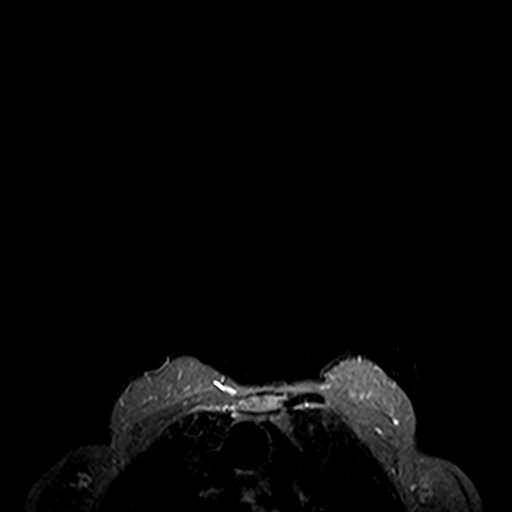
[im 57/57]
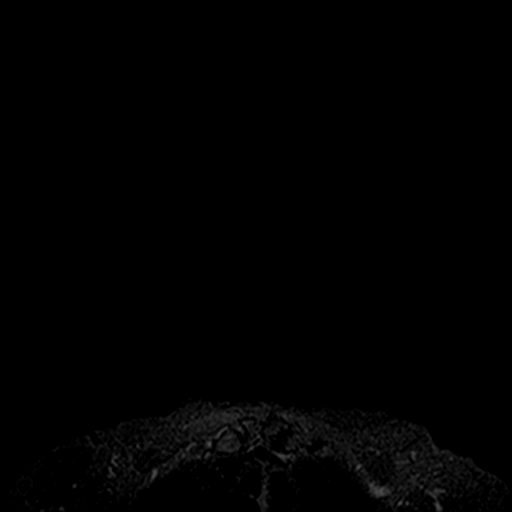

[Series 3: fl3d pre-cm · axial · non-contrast · 1.2mm · 0.94mm/px · z∈[-59,+113]mm · 8 of 144 slices shown]
[im 1/144]
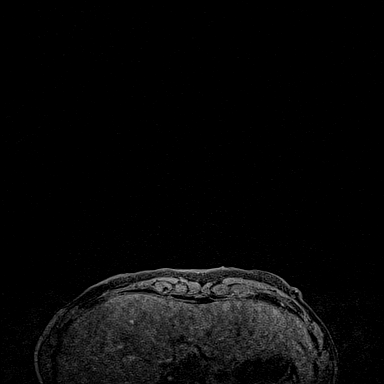
[im 23/144]
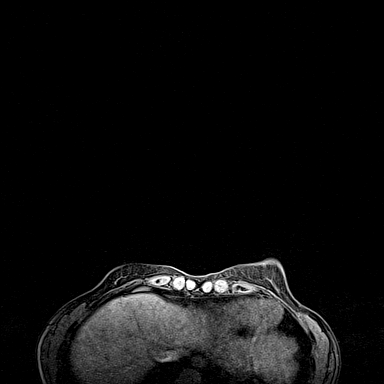
[im 45/144]
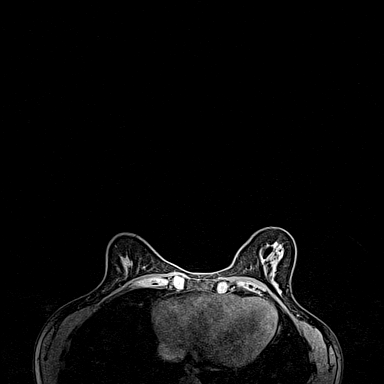
[im 67/144]
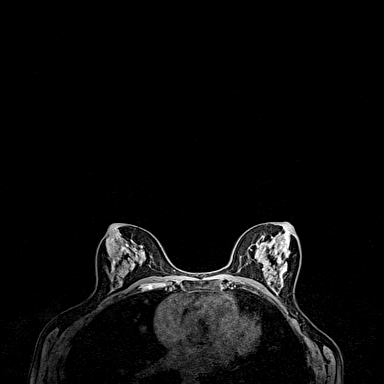
[im 78/144]
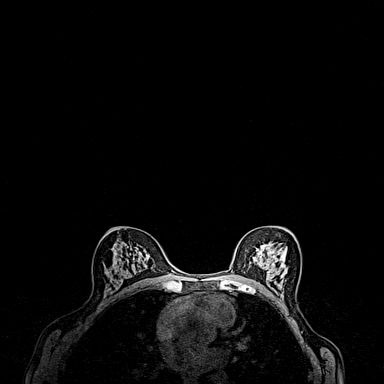
[im 100/144]
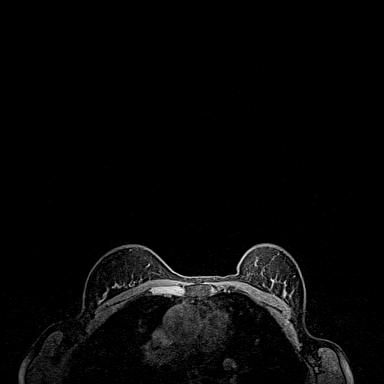
[im 122/144]
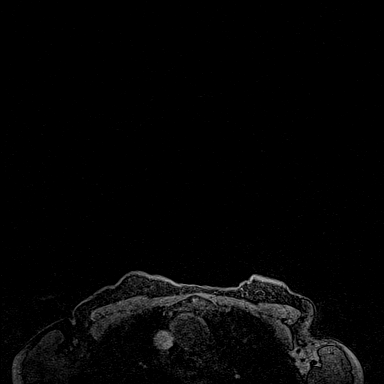
[im 144/144]
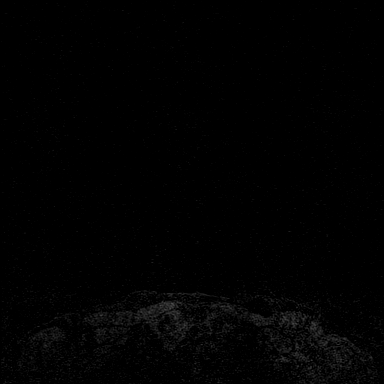

[Series 4: fl3d post immediate · axial · 1.2mm · 0.94mm/px · z∈[-59,+113]mm · 8 of 144 slices shown (1 of 3)]
[im 1/144]
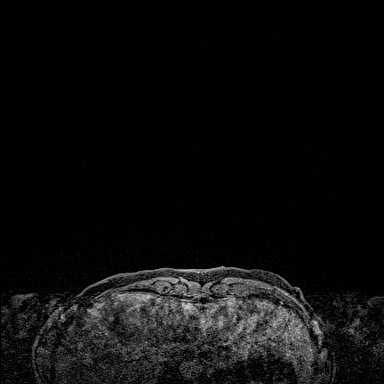
[im 23/144]
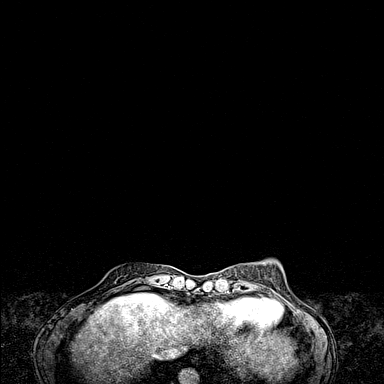
[im 45/144]
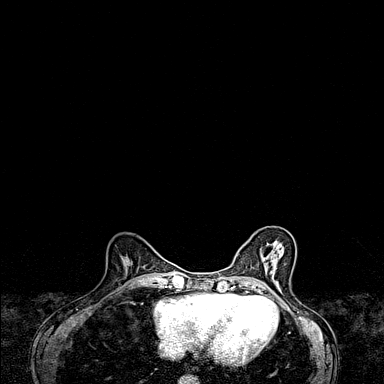
[im 67/144]
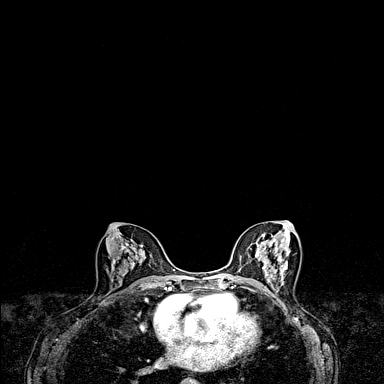
[im 78/144]
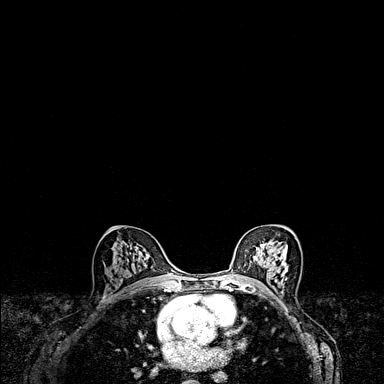
[im 100/144]
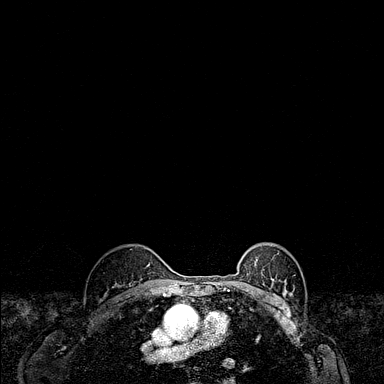
[im 122/144]
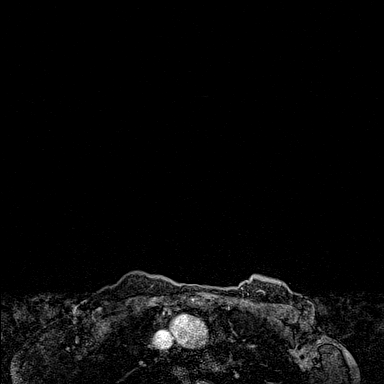
[im 144/144]
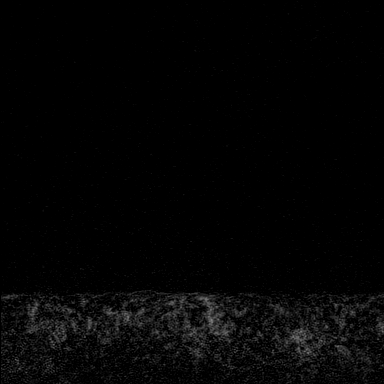

[Series 5: fl3d post immediate · axial · 1.2mm · 0.94mm/px · z∈[-59,+113]mm · 8 of 144 slices shown (2 of 3)]
[im 1/144]
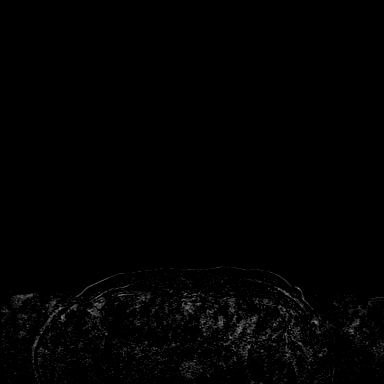
[im 23/144]
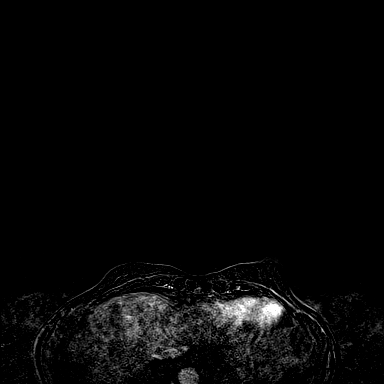
[im 45/144]
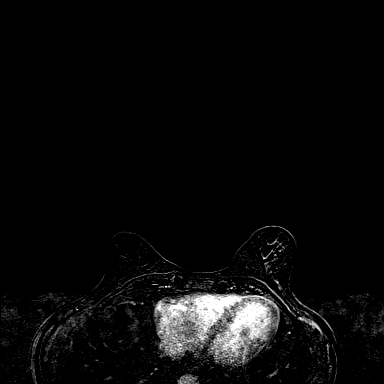
[im 67/144]
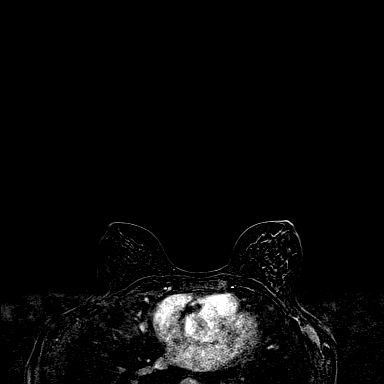
[im 78/144]
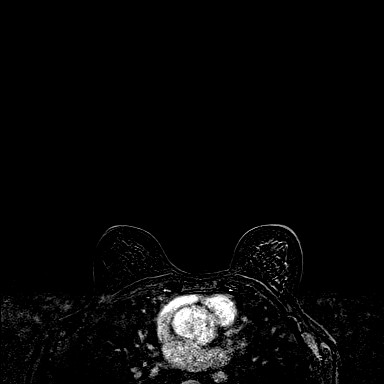
[im 100/144]
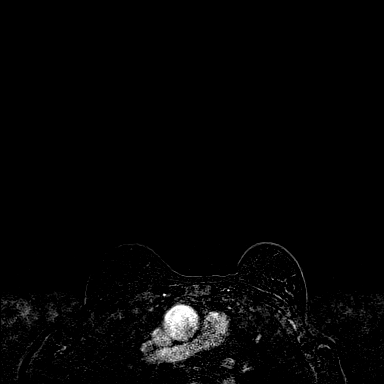
[im 122/144]
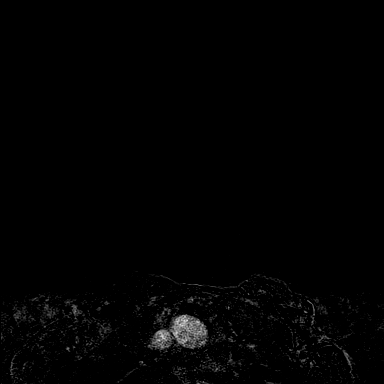
[im 144/144]
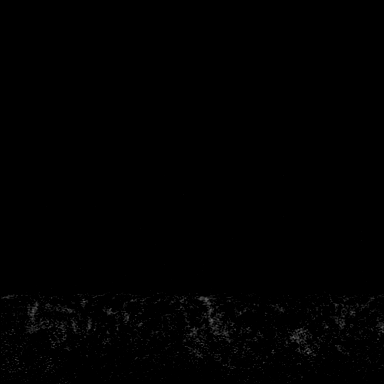

[Series 6: fl3d post immediate · axial · 172.8mm · 0.94mm/px · 1 of 1 slices shown (3 of 3)]
[im 1/1]
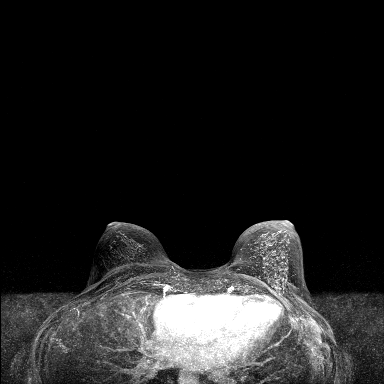

[30 of 48 positions shown; findings below may reference images not displayed]

Three-dimensional MR images were rendered by post-processing of the
original MR data on an independent workstation. The
three-dimensional MR images were interpreted, and findings are
reported in the following complete MRI report for this study. Three
dimensional images were evaluated at the independent [employer]
workstation
FINDINGS: Breast composition: d. Extreme fibroglandular tissue.

Background parenchymal enhancement: Moderate.

Right breast: No mass or abnormal enhancement.

Left breast: No mass or abnormal enhancement.

Lymph nodes: No abnormal appearing lymph nodes.

Ancillary findings: There is a T2 bright nonenhancing lesion in the
left hepatic lobe, previously characterized as a benign hemangioma.
IMPRESSION: No MRI evidence of malignancy in either breast.

RECOMMENDATION:
Recommend annual screening mammography. The patient is also eligible
for annual Abbreviated Breast MRI.

BI-RADS CATEGORY  1: Negative.

## 2022-10-06 DIAGNOSIS — J3081 Allergic rhinitis due to animal (cat) (dog) hair and dander: Secondary | ICD-10-CM | POA: Diagnosis not present

## 2022-10-06 DIAGNOSIS — J301 Allergic rhinitis due to pollen: Secondary | ICD-10-CM | POA: Diagnosis not present

## 2022-10-06 DIAGNOSIS — J3089 Other allergic rhinitis: Secondary | ICD-10-CM | POA: Diagnosis not present

## 2022-10-06 DIAGNOSIS — H1045 Other chronic allergic conjunctivitis: Secondary | ICD-10-CM | POA: Diagnosis not present

## 2022-10-21 DIAGNOSIS — Z1231 Encounter for screening mammogram for malignant neoplasm of breast: Secondary | ICD-10-CM | POA: Diagnosis not present

## 2022-11-10 DIAGNOSIS — E559 Vitamin D deficiency, unspecified: Secondary | ICD-10-CM | POA: Diagnosis not present

## 2022-11-10 DIAGNOSIS — Z0000 Encounter for general adult medical examination without abnormal findings: Secondary | ICD-10-CM | POA: Diagnosis not present

## 2022-11-10 DIAGNOSIS — Z13220 Encounter for screening for lipoid disorders: Secondary | ICD-10-CM | POA: Diagnosis not present

## 2022-11-10 DIAGNOSIS — Z131 Encounter for screening for diabetes mellitus: Secondary | ICD-10-CM | POA: Diagnosis not present

## 2022-12-02 ENCOUNTER — Telehealth: Payer: Self-pay | Admitting: Gastroenterology

## 2022-12-02 NOTE — Telephone Encounter (Signed)
Multiple inbound calls from patient wanting to schedule for colon procedure.  Patient is not due for procedure until October of this year. I have informed patient several times of this and let her know she will need to come in for an ov to speak with provider if she would like to have the procedure early.  Patient was scheduled for an ov in February to speak with provider about having colon and egd as well but states she is living out of town in [place] and is not able to come into office for the apt to be seen.  Patient is wanting to schedule for April for ov, I Informed patient that we do not have the calendar out for her to schedule for April at this time. Patient states she does not want to miss the window to schedule for procedure which again is not due until October of 2024. but she would like to speak with the provider personally on the phone.    Please advise.

## 2022-12-02 NOTE — Telephone Encounter (Signed)
Spoke with patient regarding recall colon & OV. She is not due until 09/2023, but is concerned that if she doesn't schedule now that her procedure will be pushed further out. Advised her that we do not schedule this far in advance & that we have a reminder in place for when it is time to schedule. She would like an OV as well to discuss adding on an EGD. [place], and requesting an April appointment. Advised she call back in February to schedule OV. Pt verbalized all understanding, no further questions.

## 2022-12-25 ENCOUNTER — Telehealth: Payer: Self-pay | Admitting: Internal Medicine

## 2022-12-25 DIAGNOSIS — R0789 Other chest pain: Secondary | ICD-10-CM

## 2022-12-25 NOTE — Telephone Encounter (Signed)
Patient would like a call back regarding doing a CT Cardiac Scoring test.

## 2022-12-29 NOTE — Telephone Encounter (Signed)
Left a message for the pt to call back.   From her 01/2022 note: Cardiac risk assessment   Discussed with pt   Her brothers event was idiosyncratic    Rxn to procedure +/- anesthesia   I do not think rhythm or vascular problem    With concerns about FHx will have pt get Ca score

## 2022-12-31 NOTE — Telephone Encounter (Signed)
Patient was returning call. Please advise ?

## 2022-12-31 NOTE — Telephone Encounter (Signed)
Left a message for the pt to call back.  

## 2023-01-01 NOTE — Telephone Encounter (Signed)
Patient returning call.

## 2023-01-01 NOTE — Telephone Encounter (Signed)
Pt says she is ready to have the CT Calcium Score but not until after her trip and would like to have it in April 2024.   Information mailed to her per her request.

## 2023-01-29 ENCOUNTER — Ambulatory Visit: Payer: BC Managed Care – PPO | Admitting: Gastroenterology

## 2023-03-03 DIAGNOSIS — H5213 Myopia, bilateral: Secondary | ICD-10-CM | POA: Diagnosis not present

## 2023-03-03 DIAGNOSIS — H2513 Age-related nuclear cataract, bilateral: Secondary | ICD-10-CM | POA: Diagnosis not present

## 2023-03-05 ENCOUNTER — Ambulatory Visit: Payer: BC Managed Care – PPO | Admitting: Gastroenterology

## 2023-03-10 ENCOUNTER — Ambulatory Visit (INDEPENDENT_AMBULATORY_CARE_PROVIDER_SITE_OTHER): Payer: BC Managed Care – PPO | Admitting: Gastroenterology

## 2023-03-10 ENCOUNTER — Encounter: Payer: Self-pay | Admitting: Gastroenterology

## 2023-03-10 VITALS — BP 120/76 | HR 70 | Ht 67.5 in | Wt 124.4 lb

## 2023-03-10 DIAGNOSIS — Z86010 Personal history of colonic polyps: Secondary | ICD-10-CM | POA: Diagnosis not present

## 2023-03-10 DIAGNOSIS — K219 Gastro-esophageal reflux disease without esophagitis: Secondary | ICD-10-CM | POA: Diagnosis not present

## 2023-03-10 MED ORDER — FAMOTIDINE 40 MG PO TABS: 40.0000 mg | ORAL_TABLET | Freq: Two times a day (BID) | ORAL | 11 refills | Status: AC

## 2023-03-10 NOTE — Patient Instructions (Signed)
We have sent the following medications to your pharmacy for you to pick up at your convenience: famotidine 40 mg twice daily.   Call back one month when we have October schedule out to schedule EGD/Colonoscopy.  The [facility] providers would like to encourage you to use [facility] to communicate with providers for non-urgent requests or questions.  Due to long hold times on the telephone, sending your provider a message by [facility] may be a faster and more efficient way to get a response.  Please allow 48 business hours for a response.  Please remember that this is for non-urgent requests.   Thank you for choosing me and [facility].  Venita Lick. Pleas Koch., MD., Clementeen Graham

## 2023-03-10 NOTE — Progress Notes (Signed)
    Assessment     Personal history of sessile serrated and adenomatous colon polyps.  Family history of colon cancer, first-degree relative at age 63 GERD, suspected LPR.  R/O esophagitis, Barrett's   Recommendations    Start famotidine 40 mg po bid and continue to follow antireflux measures ENT evaluation is scheduled this week to evaluate vocal symptoms Surveillance colonoscopy is due in Oct 2021 and we will plan to schedule EGD at the time of her colonoscopy. We can proceed with EGD sooner if she desires. For now she is comfortable with EGD in October.  REV in 1 year   HPI    This is a 62 year old female with personal history of colon polyps and GERD.  She relates recurrent problems with acid reflux despite carefully controlling her diet.  She notes occasional heartburn and frequent hoarseness and vocal fatigue.  She is concerned about potential side effects from Nexium including dementia so she discontinued Nexium.  She experienced about a 10 pound weight loss over the past year and her weight is stabilized. Her last colonoscopy was performed in October 2021 with 3 SSPs and 1 TA.  She underwent EGD in August 2009 which was normal.     Labs / Imaging        No data to display              No data to display         Current Medications, Allergies, Past Medical History, Past Surgical History, Family History and Social History were reviewed in [employer] record.   Physical Exam: General: Well developed, well nourished, no acute distress Head: Normocephalic and atraumatic Eyes: Sclerae anicteric, EOMI Ears: Normal auditory acuity Mouth: No deformities or lesions noted Lungs: Clear throughout to auscultation Heart: Regular rate and rhythm; No murmurs, rubs or bruits Abdomen: Soft, non tender and non distended. No masses, hepatosplenomegaly or hernias noted. Normal Bowel sounds Rectal: Deferred to colonoscopy  Musculoskeletal: Symmetrical with no  gross deformities  Pulses:  Normal pulses noted Extremities: No edema or deformities noted Neurological: Alert oriented x 4, grossly nonfocal Psychological:  Alert and cooperative. Normal mood and affect   Kla Bily T. Russella Dar, MD 03/10/2023, 2:12 PM

## 2023-03-11 ENCOUNTER — Telehealth: Payer: Self-pay | Admitting: Pharmacy Technician

## 2023-03-11 ENCOUNTER — Telehealth: Payer: Self-pay | Admitting: Gastroenterology

## 2023-03-11 DIAGNOSIS — H6991 Unspecified Eustachian tube disorder, right ear: Secondary | ICD-10-CM | POA: Diagnosis not present

## 2023-03-11 DIAGNOSIS — R519 Headache, unspecified: Secondary | ICD-10-CM | POA: Diagnosis not present

## 2023-03-11 NOTE — Telephone Encounter (Signed)
Patient Advocate Encounter  Received notification from [facility] that prior authorization for FAMOTIDINE  is required.   PA submitted on 4.10.24 Key [id number] Status is pending

## 2023-03-11 NOTE — Telephone Encounter (Signed)
Patient called would like to discuss the Famotidine dosage given yesterday because she said she usually gets 20 mg and this one was 40 mg.

## 2023-03-11 NOTE — Telephone Encounter (Signed)
Informed patient that Dr. Russella Dar increased her famotidine to 40 mg twice daily at her last appointment. Patient states she was on famotidine 20 mg previously and wanted to make sure she was suppose to be on the 40 mg dosage. Patient states she will try the famotidine 40 mg to get her symptoms under control but she may decrease to 20 mg long-term at some point after her symptoms are stable.

## 2023-03-12 NOTE — Telephone Encounter (Signed)
She can take OTC famotidine 20 mg take 2 po bid

## 2023-03-12 NOTE — Telephone Encounter (Signed)
PA has been DENIED. Denial letter has been attached in patients documents.  

## 2023-03-12 NOTE — Telephone Encounter (Signed)
The pt has been advised that she can take OTC famotidine.  She has picked that up and will call if she has any further concerns.

## 2023-03-30 DIAGNOSIS — Z124 Encounter for screening for malignant neoplasm of cervix: Secondary | ICD-10-CM | POA: Diagnosis not present

## 2023-03-30 DIAGNOSIS — Z01419 Encounter for gynecological examination (general) (routine) without abnormal findings: Secondary | ICD-10-CM | POA: Diagnosis not present

## 2023-03-30 DIAGNOSIS — Z681 Body mass index (BMI) 19 or less, adult: Secondary | ICD-10-CM | POA: Diagnosis not present

## 2023-03-30 DIAGNOSIS — R350 Frequency of micturition: Secondary | ICD-10-CM | POA: Diagnosis not present

## 2023-04-22 ENCOUNTER — Ambulatory Visit (HOSPITAL_BASED_OUTPATIENT_CLINIC_OR_DEPARTMENT_OTHER)
Admission: RE | Admit: 2023-04-22 | Discharge: 2023-04-22 | Disposition: A | Discharge status: Home / Self Care | Payer: BC Managed Care – PPO | Source: Ambulatory Visit | Attending: Internal Medicine | Admitting: Internal Medicine

## 2023-04-22 DIAGNOSIS — R0789 Other chest pain: Secondary | ICD-10-CM | POA: Insufficient documentation

## 2023-04-28 ENCOUNTER — Telehealth: Payer: Self-pay

## 2023-04-28 DIAGNOSIS — Z79899 Other long term (current) drug therapy: Secondary | ICD-10-CM

## 2023-04-28 DIAGNOSIS — E782 Mixed hyperlipidemia: Secondary | ICD-10-CM

## 2023-04-28 MED ORDER — ROSUVASTATIN CALCIUM 10 MG PO TABS: 10.0000 mg | ORAL_TABLET | Freq: Every day | ORAL | 3 refills | Status: DC

## 2023-04-28 NOTE — Telephone Encounter (Signed)
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

## 2023-04-28 NOTE — Telephone Encounter (Signed)
Pt advised and will have labs July 2024.

## 2023-04-29 ENCOUNTER — Encounter: Payer: Self-pay | Admitting: Gastroenterology

## 2023-05-29 DIAGNOSIS — H0012 Chalazion right lower eyelid: Secondary | ICD-10-CM | POA: Diagnosis not present

## 2023-07-06 ENCOUNTER — Ambulatory Visit: Payer: BC Managed Care – PPO | Attending: Cardiovascular Disease

## 2023-07-06 DIAGNOSIS — Z79899 Other long term (current) drug therapy: Secondary | ICD-10-CM

## 2023-07-06 DIAGNOSIS — E782 Mixed hyperlipidemia: Secondary | ICD-10-CM

## 2023-07-08 ENCOUNTER — Telehealth: Payer: Self-pay | Admitting: Internal Medicine

## 2023-07-08 NOTE — Telephone Encounter (Signed)
Left message to call office

## 2023-07-08 NOTE — Telephone Encounter (Signed)
Patient returned RN's call. 

## 2023-07-08 NOTE — Telephone Encounter (Signed)
Pt is requesting a callback regarding her results. Please advise

## 2023-07-08 NOTE — Telephone Encounter (Signed)
-----   Message from [place] sent at 07/07/2023  9:56 PM EDT ----- Lipids are excellent   LDL 47    Liver function tests normal Keep on same meds

## 2023-07-08 NOTE — Telephone Encounter (Signed)
I spoke with patient and reviewed lab results with her.  Follow up appointment with Dr Tenny Craw scheduled for December 3

## 2023-07-09 DIAGNOSIS — N644 Mastodynia: Secondary | ICD-10-CM | POA: Diagnosis not present

## 2023-07-14 DIAGNOSIS — L718 Other rosacea: Secondary | ICD-10-CM | POA: Diagnosis not present

## 2023-07-14 DIAGNOSIS — L570 Actinic keratosis: Secondary | ICD-10-CM | POA: Diagnosis not present

## 2023-07-14 DIAGNOSIS — L84 Corns and callosities: Secondary | ICD-10-CM | POA: Diagnosis not present

## 2023-07-14 DIAGNOSIS — L821 Other seborrheic keratosis: Secondary | ICD-10-CM | POA: Diagnosis not present

## 2023-07-14 DIAGNOSIS — D1801 Hemangioma of skin and subcutaneous tissue: Secondary | ICD-10-CM | POA: Diagnosis not present

## 2023-07-15 ENCOUNTER — Other Ambulatory Visit (HOSPITAL_COMMUNITY): Payer: Self-pay

## 2023-08-06 DIAGNOSIS — H5711 Ocular pain, right eye: Secondary | ICD-10-CM | POA: Diagnosis not present

## 2023-08-06 DIAGNOSIS — H0012 Chalazion right lower eyelid: Secondary | ICD-10-CM | POA: Diagnosis not present

## 2023-08-11 ENCOUNTER — Ambulatory Visit (AMBULATORY_SURGERY_CENTER): Payer: BC Managed Care – PPO | Admitting: *Deleted

## 2023-08-11 VITALS — Ht 67.5 in | Wt 122.0 lb

## 2023-08-11 DIAGNOSIS — Z86010 Personal history of colonic polyps: Secondary | ICD-10-CM

## 2023-08-11 DIAGNOSIS — K219 Gastro-esophageal reflux disease without esophagitis: Secondary | ICD-10-CM

## 2023-08-11 DIAGNOSIS — Z800 Family history of malignant neoplasm of digestive organs: Secondary | ICD-10-CM

## 2023-08-11 MED ORDER — NA SULFATE-K SULFATE-MG SULF 17.5-3.13-1.6 GM/177ML PO SOLN
1.0000 | Freq: Once | ORAL | 0 refills | Status: AC
Start: 2023-08-11 — End: 2023-08-11

## 2023-08-11 NOTE — Progress Notes (Signed)
Pt's name and DOB verified at the beginning of the pre-visit.  Pt denies any difficulty with ambulating,sitting, laying down or rolling side to side Gave both [facility] main # and MD on call # prior to instructions.  No egg or soy allergy known to patient  No issues known to pt with past sedation with any surgeries or procedures Pt denies having issues being intubated Pt has no issues moving head neck or swallowing No FH of Malignant Hyperthermia Pt is not on diet pills Pt is not on home 02  Pt is not on blood thinners  Pt has frequent issues with constipation RN instructed pt to use Miralax per bottles instructions a week before prep days. Pt states they will Pt is not on dialysis Pt denise any abnormal heart rhythms  Pt denies any upcoming cardiac testing Pt encouraged to use to use Singlecare or Goodrx to reduce cost  Patient's chart reviewed by Cathlyn Parsons CNRA prior to pre-visit and patient appropriate for the [facility].  Pre-visit completed and red dot placed by patient's name on their procedure day (on provider's schedule).  . Visit by phone Pt states weight is 122 lb Instructed pt why it is important to and  to call if they have any changes in health or new medications. Directed them to the # given and on instructions.   Pt states they will.  Instructions reviewed with pt and pt states understanding. Instructed to review again prior to procedure. Pt states they will.  Instructions sent by mail with coupon and by my chart

## 2023-08-13 ENCOUNTER — Encounter: Payer: Self-pay | Admitting: Gastroenterology

## 2023-08-25 ENCOUNTER — Telehealth: Payer: Self-pay | Admitting: Gastroenterology

## 2023-08-25 NOTE — Telephone Encounter (Signed)
PT is calling to have updated instructions sent via mychart. Please advise.

## 2023-08-25 NOTE — Telephone Encounter (Signed)
Instructions updated and sent to pt.via my chart,she is aware.

## 2023-09-01 ENCOUNTER — Encounter: Payer: BC Managed Care – PPO | Admitting: Gastroenterology

## 2023-09-09 ENCOUNTER — Encounter: Payer: Self-pay | Admitting: Gastroenterology

## 2023-10-20 ENCOUNTER — Telehealth: Payer: Self-pay | Admitting: *Deleted

## 2023-10-20 NOTE — Telephone Encounter (Signed)
Discussed with pt's questions about Miralax. Reviewed instructions on the Suprep medication use. And ways to avoid the taste. Pt state she understood instructions and will call if she has any questions after reviewing instructions. No other questions at this time.

## 2023-10-20 NOTE — Telephone Encounter (Signed)
igned      Discussed with pt's questions about Miralax. Reviewed instructions on the Suprep medication use. And ways to avoid the taste. Pt state she understood instructions and will call if she has any questions after reviewing instructions. No other questions at this time.

## 2023-10-20 NOTE — Telephone Encounter (Signed)
Inbound call from patient, states she has questions about how to hold Miralax. Patient is requesting to speak to a nurse.

## 2023-10-25 NOTE — Telephone Encounter (Signed)
Patient called earlier today, stating she was scheduled for colonoscopy Tuesday, 10/27/2023. She has been dealing with an eye infection and is currently on Keflex, wanted to make sure that that would not interfere with her prep etc.  She is also been taking eyedrops for the eye infection.  Was advised should be fine to proceed with bowel prep and colonoscopy

## 2023-10-26 ENCOUNTER — Telehealth: Payer: Self-pay

## 2023-10-26 NOTE — Telephone Encounter (Signed)
Attempted to call patient back but had to leave a voicemail

## 2023-10-26 NOTE — Telephone Encounter (Signed)
Called patient back and went over her prep times with her, explained for her to be NPO at 11:30 am tomorrow before coming in for her procedure. Patient understands and had no further questions.

## 2023-10-26 NOTE — Telephone Encounter (Signed)
Patient needing to be further advised on prep instructions. Please advise.

## 2023-10-27 ENCOUNTER — Ambulatory Visit: Payer: BC Managed Care – PPO | Admitting: Gastroenterology

## 2023-10-27 ENCOUNTER — Encounter: Payer: Self-pay | Admitting: Gastroenterology

## 2023-10-27 VITALS — BP 126/82 | HR 60 | Temp 97.7°F | Resp 13 | Ht 67.5 in | Wt 122.0 lb

## 2023-10-27 DIAGNOSIS — K449 Diaphragmatic hernia without obstruction or gangrene: Secondary | ICD-10-CM | POA: Diagnosis not present

## 2023-10-27 DIAGNOSIS — Z860100 Personal history of colon polyps, unspecified: Secondary | ICD-10-CM

## 2023-10-27 DIAGNOSIS — K641 Second degree hemorrhoids: Secondary | ICD-10-CM | POA: Diagnosis not present

## 2023-10-27 DIAGNOSIS — Z1211 Encounter for screening for malignant neoplasm of colon: Secondary | ICD-10-CM | POA: Diagnosis not present

## 2023-10-27 DIAGNOSIS — D127 Benign neoplasm of rectosigmoid junction: Secondary | ICD-10-CM | POA: Diagnosis not present

## 2023-10-27 DIAGNOSIS — Z800 Family history of malignant neoplasm of digestive organs: Secondary | ICD-10-CM

## 2023-10-27 DIAGNOSIS — K222 Esophageal obstruction: Secondary | ICD-10-CM | POA: Diagnosis not present

## 2023-10-27 DIAGNOSIS — K635 Polyp of colon: Secondary | ICD-10-CM

## 2023-10-27 DIAGNOSIS — K259 Gastric ulcer, unspecified as acute or chronic, without hemorrhage or perforation: Secondary | ICD-10-CM

## 2023-10-27 DIAGNOSIS — Z860101 Personal history of adenomatous and serrated colon polyps: Secondary | ICD-10-CM | POA: Diagnosis not present

## 2023-10-27 DIAGNOSIS — K5730 Diverticulosis of large intestine without perforation or abscess without bleeding: Secondary | ICD-10-CM | POA: Diagnosis not present

## 2023-10-27 DIAGNOSIS — D128 Benign neoplasm of rectum: Secondary | ICD-10-CM | POA: Diagnosis not present

## 2023-10-27 DIAGNOSIS — K219 Gastro-esophageal reflux disease without esophagitis: Secondary | ICD-10-CM | POA: Diagnosis not present

## 2023-10-27 DIAGNOSIS — D125 Benign neoplasm of sigmoid colon: Secondary | ICD-10-CM

## 2023-10-27 DIAGNOSIS — K2950 Unspecified chronic gastritis without bleeding: Secondary | ICD-10-CM | POA: Diagnosis not present

## 2023-10-27 MED ORDER — SODIUM CHLORIDE 0.9 % IV SOLN: 500.0000 mL | Freq: Once | INTRAVENOUS | Status: DC

## 2023-10-27 MED ORDER — ONDANSETRON HCL 4 MG PO TABS
4.0000 mg | ORAL_TABLET | Freq: Once | ORAL | Status: AC
  Administered 2023-10-27: 4 mg via ORAL

## 2023-10-27 NOTE — Progress Notes (Signed)
Pt c/o of nausea prior to d/c.  Zofran 4mg  SL given at 1614pm.

## 2023-10-27 NOTE — Progress Notes (Signed)
Pt in recovery with monitors in place, VSS. Report given to receiving RN. Bite guard was placed with pt awake to ensure comfort. No dental or soft tissue damage noted. 

## 2023-10-27 NOTE — Progress Notes (Signed)
History & Physical  Primary Care Physician:  Marcelle Overlie, MD Primary Gastroenterologist: Claudette Head, MD  Impression / Plan:  Personal history of adenomatous and sessile serrated colon polyps, family history of colon cancer and GERD for colonoscopy and EGD.  CHIEF COMPLAINT:  Personal history of colon polyps, [facility], GERD   HPI: Virginia Liu is a 62 y.o. female with a personal history of adenomatous and sessile serrated colon polyps and a family history of colon cancer, first-degree relative, and GERD for colonoscopy and EGD.    Past Medical History:  Diagnosis Date   ABDOMINAL PAIN-EPIGASTRIC 01/15/2010   Qualifier: Diagnosis of  By: Russella Dar MD Bronson Curb T    ABDOMINAL PAIN-RUQ 05/01/2008   Qualifier: Diagnosis of  By: Russella Dar MD Bronson Curb T    Adenomatous colon polyp 12/2005   ADENOMATOUS COLONIC POLYP 12/17/2005   Qualifier: Diagnosis of  By: Misty Stanley CMA (AAMA), Amanda     Allergy    Ankle fracture    Dry eye    restasis use    Ear pressure, right 03/03/2017   GERD 05/01/2008   Qualifier: Diagnosis of  By: Misty Stanley CMA (AAMA), Marchelle Folks     GERD (gastroesophageal reflux disease)    LPR   Hemorrhoids    Osteopenia    Plantar fasciitis 12/24/2011   Left shows a small tear at insertion and very thick  RT shows old changes w large spur    Syncope    seems to vagal with sight of blood    Past Surgical History:  Procedure Laterality Date   COLONOSCOPY     DENTAL EXAMINATION UNDER ANESTHESIA     POLYPECTOMY     TUBAL LIGATION     UPPER GASTROINTESTINAL ENDOSCOPY      Prior to Admission medications   Medication Sig Start Date End Date Taking? Authorizing Provider  Azelaic Acid (FINACEA EX) Apply topically as needed. For rosecea Patient not taking: Reported on 08/11/2023    [provider]  cetirizine (ZYRTEC) 10 MG tablet Take 10 mg by mouth daily. 01/30/22   [provider]  cholecalciferol (VITAMIN D) 1000 UNITS tablet Take 1,000 Units  by mouth daily.      [provider]  [facility] Fiber Gummy Bears Children CHEW Chew by mouth.    [provider]  esomeprazole (NEXIUM) 20 MG capsule daily. Patient not taking: Reported on 03/10/2023 01/30/21   [provider]  Estradiol-Norethindrone Acet 0.5-0.1 MG tablet Take 1 tablet by mouth daily. 05/10/18   [provider]  famotidine (PEPCID) 40 MG tablet Take 1 tablet (40 mg total) by mouth 2 (two) times daily. 03/10/23   Meryl Dare, MD  metroNIDAZOLE (METROCREAM) 0.75 % cream as needed.    [provider]  Multiple Vitamins-Minerals (CENTRUM SILVER) CHEW Chew 1 tablet by mouth as needed.     [provider]  omeprazole (PRILOSEC) 20 MG capsule Take 20-40 mg by mouth as needed. Patient not taking: Reported on 03/10/2023 01/30/22   [provider]  rosuvastatin (CRESTOR) 10 MG tablet Take 1 tablet (10 mg total) by mouth daily. 04/28/23   Pricilla Riffle, MD  tretinoin (RETIN-A) 0.025 % cream Apply topically as needed. Pt uses for hemorrhoids 10/04/14   [provider]    Current Outpatient Medications  Medication Sig Dispense Refill   Azelaic Acid (FINACEA EX) Apply topically as needed. For rosecea (Patient not taking: Reported on 08/11/2023)     cetirizine (ZYRTEC) 10 MG tablet Take 10  mg by mouth daily.     cholecalciferol (VITAMIN D) 1000 UNITS tablet Take 1,000 Units by mouth daily.       [employer] Fiber Gummy Bears Children CHEW Chew by mouth.     esomeprazole (NEXIUM) 20 MG capsule daily. (Patient not taking: Reported on 03/10/2023)     Estradiol-Norethindrone Acet 0.5-0.1 MG tablet Take 1 tablet by mouth daily.  10   famotidine (PEPCID) 40 MG tablet Take 1 tablet (40 mg total) by mouth 2 (two) times daily. 60 tablet 11   metroNIDAZOLE (METROCREAM) 0.75 % cream as needed.     Multiple Vitamins-Minerals (CENTRUM SILVER) CHEW Chew 1 tablet by mouth as needed.      omeprazole (PRILOSEC) 20 MG capsule Take 20-40 mg by mouth as  needed. (Patient not taking: Reported on 03/10/2023)     rosuvastatin (CRESTOR) 10 MG tablet Take 1 tablet (10 mg total) by mouth daily. 90 tablet 3   tretinoin (RETIN-A) 0.025 % cream Apply topically as needed. Pt uses for hemorrhoids  0   Current Facility-Administered Medications  Medication Dose Route Frequency Provider Last Rate Last Admin   0.9 %  sodium chloride infusion  500 mL Intravenous Once Meryl Dare, MD        Allergies as of 10/27/2023 - Review Complete 10/27/2023  Allergen Reaction Noted   Benzonatate  02/10/2022   Molds & smuts  08/11/2023   Pollen extract  08/11/2023   Quercus robur  02/18/2022   Latex Rash 03/03/2017    Family History  Problem Relation Age of Onset   Alzheimer's disease Mother    Hypertension Mother    Heart failure Mother    Colon cancer Father 22   Colon polyps Brother    Rectal cancer Neg Hx    Stomach cancer Neg Hx    Esophageal cancer Neg Hx     Social History   Socioeconomic History   Marital status: Married    Spouse name: Not on file   Number of children: 2   Years of education: Not on file   Highest education level: Not on file  Occupational History   Occupation: Dentist: [employer]  Tobacco Use   Smoking status: Never   Smokeless tobacco: Never  Vaping Use   Vaping status: Never Used  Substance and Sexual Activity   Alcohol use: Yes    Alcohol/week: 1.0 standard drink of alcohol    Types: 1 Glasses of wine per week    Comment: 1-2 per month   Drug use: No   Sexual activity: Yes  Other Topics Concern   Not on file  Social History Narrative   Not on file   Social Determinants of Health   Financial Resource Strain: Not on file  Food Insecurity: Low Risk  (03/11/2023)   [facility]   Hunger Vital Sign    Worried About Running Out of Food in the Last Year: Never true    Ran Out of Food in the Last Year: Never true  Transportation Needs: No Transportation Needs  (03/11/2023)   [facility]   Transportation    In the past 12 months, has lack of reliable transportation kept you from medical appointments, meetings, work or from getting things needed for daily living? : No  Physical Activity: Not on file  Stress: Not on file  Social Connections: Not on file  Intimate Partner Violence: Not on file    Review of  Systems:  All systems reviewed were negative except where noted in HPI.   Physical Exam:  General:  Alert, well-developed, in NAD Head:  Normocephalic and atraumatic. Eyes:  Sclera clear, no icterus.   Conjunctiva pink. Ears:  Normal auditory acuity. Mouth:  No deformity or lesions.  Neck:  Supple; no masses. Lungs:  Clear throughout to auscultation.   No wheezes, crackles, or rhonchi.  Heart:  Regular rate and rhythm; no murmurs. Abdomen:  Soft, nondistended, nontender. No masses, hepatomegaly. No palpable masses.  Normal bowel sounds.    Rectal:  Deferred   Msk:  Symmetrical without gross deformities. Extremities:  Without edema. Neurologic:  Alert and  oriented x 4; grossly normal neurologically. Skin:  Intact without significant lesions or rashes. Psych:  Alert and cooperative. Normal mood and affect.   Venita Lick. Russella Dar  10/27/2023, 2:10 PM See Loretha Stapler,  GI, to contact our on call provider

## 2023-10-27 NOTE — Op Note (Signed)
[facility] Patient Name: Virginia Liu Procedure Date: 10/27/2023 2:39 PM MRN: [id number] Endoscopist: Meryl Dare , MD, [phone] Age: 62 Referring MD:  Date of Birth: 19-Aug-1961 Gender: Female Account #: [id number] Procedure:                Upper GI endoscopy Indications:              Gastroesophageal reflux disease Medicines:                Monitored Anesthesia Care Procedure:                Pre-Anesthesia Assessment:                           - Prior to the procedure, a History and Physical                            was performed, and patient medications and                            allergies were reviewed. The patient's tolerance of                            previous anesthesia was also reviewed. The risks                            and benefits of the procedure and the sedation                            options and risks were discussed with the patient.                            All questions were answered, and informed consent                            was obtained. Prior Anticoagulants: The patient has                            taken no anticoagulant or antiplatelet agents. ASA                            Grade Assessment: II - A patient with mild systemic                            disease. After reviewing the risks and benefits,                            the patient was deemed in satisfactory condition to                            undergo the procedure.                           After obtaining informed consent, the endoscope was  passed under direct vision. Throughout the                            procedure, the patient's blood pressure, pulse, and                            oxygen saturations were monitored continuously. The                            GIF [id number] #[id number] was introduced through the                            mouth, and advanced to the second part of duodenum.                            The upper GI  endoscopy was accomplished without                            difficulty. The patient tolerated the procedure                            well. Scope In: Scope Out: Findings:                 One benign-appearing, intrinsic mild stenosis was                            found 36 cm from the incisors. This stenosis                            measured 1.5 cm (inner diameter) x less than one cm                            (in length). The stenosis was traversed.                           A single 4 mm non-bleeding erosion was found at the                            gastroesophageal junction. Biopsies were taken with                            a cold forceps for histology.                           The exam of the esophagus was otherwise normal.                           A medium-sized hiatal hernia was present.                           The gastroesophageal flap valve was visualized                            endoscopically and classified  as Hill Grade IV (no                            fold, wide open lumen, hiatal hernia present).                           The exam of the stomach was otherwise normal.                           The duodenal bulb and second portion of the                            duodenum were normal. Complications:            No immediate complications. Estimated Blood Loss:     Estimated blood loss was minimal. Impression:               - Benign-appearing esophageal stenosis.                           - A single non-bleeding erosion at the                            gastroesophageal junction. Biopsied.                           - Medium-sized hiatal hernia.                           - Gastroesophageal flap valve classified as Hill                            Grade IV (no fold, wide open lumen, hiatal hernia                            present).                           - Normal duodenal bulb and second portion of the                            duodenum. Recommendation:            - Patient has a contact number available for                            emergencies. The signs and symptoms of potential                            delayed complications were discussed with the                            patient. Return to normal activities tomorrow.                            Written discharge instructions were provided to the  patient.                           - Resume previous diet.                           - Follow antireflux measures.                           - Continue present medications.                           - Resume famotidine 40 mg qd to bid and if not                            effective then Nexium 40 mg qd                           - Await pathology results. Meryl Dare, MD 10/27/2023 3:27:26 PM This report has been signed electronically.

## 2023-10-27 NOTE — Progress Notes (Signed)
Called to room to assist during endoscopic procedure.  Patient ID and intended procedure confirmed with present staff. Received instructions for my participation in the procedure from the performing physician.  

## 2023-10-27 NOTE — Patient Instructions (Addendum)
- Resume previous diet.                           - Follow antireflux measures.                           - Continue present medications.                           - Resume famotidine 40 mg qd to bid and if not                            effective then Nexium 40 mg every day                           - 2 polyps removed and sent to pathology.  Diverticulosis and hemorrhoids.                            - Await pathology results.                           - Repeat colonoscopy, likely 5 years, after studies                            are complete for surveillance based on pathology                            results.                           - Resume previous diet adding high fiber.                           - Schedule hemorrhoid banding.  YOU HAD AN ENDOSCOPIC PROCEDURE TODAY AT THE [facility]:   Refer to the procedure report that was given to you for any specific questions about what was found during the examination.  If the procedure report does not answer your questions, please call your gastroenterologist to clarify.  If you requested that your care partner not be given the details of your procedure findings, then the procedure report has been included in a sealed envelope for you to review at your convenience later.  YOU SHOULD EXPECT: Some feelings of bloating in the abdomen. Passage of more gas than usual.  Walking can help get rid of the air that was put into your GI tract during the procedure and reduce the bloating. If you had a lower endoscopy (such as a colonoscopy or flexible sigmoidoscopy) you may notice spotting of blood in your stool or on the toilet paper. If you underwent a bowel prep for your procedure, you may not have a normal bowel movement for a few days.  Please Note:  You might notice some irritation and congestion in your nose or some drainage.  This is from the oxygen used during your procedure.  There is no need for concern and it  should clear up in a day or so.  SYMPTOMS TO REPORT IMMEDIATELY:  Following lower endoscopy (colonoscopy or flexible sigmoidoscopy):  Excessive amounts of  blood in the stool  Significant tenderness or worsening of abdominal pains  Swelling of the abdomen that is new, acute  Fever of 100F or higher  Following upper endoscopy (EGD)  Vomiting of blood or coffee ground material  New chest pain or pain under the shoulder blades  Painful or persistently difficult swallowing  New shortness of breath  Fever of 100F or higher  Black, tarry-looking stools  For urgent or emergent issues, a gastroenterologist can be reached at any hour by calling [phone]. Do not use MyChart messaging for urgent concerns.    DIET:  We do recommend a small meal at first, but then you may proceed to your regular diet.  Drink plenty of fluids but you should avoid alcoholic beverages for 24 hours.  ACTIVITY:  You should plan to take it easy for the rest of today and you should NOT DRIVE or use heavy machinery until tomorrow (because of the sedation medicines used during the test).    FOLLOW UP: Our staff will call the number listed on your records the next business day following your procedure.  We will call around 7:15- 8:00 am to check on you and address any questions or concerns that you may have regarding the information given to you following your procedure. If we do not reach you, we will leave a message.     If any biopsies were taken you will be contacted by phone or by letter within the next 1-3 weeks.  Please call us at [phone] if you have not heard about the biopsies in 3 weeks.    SIGNATURES/CONFIDENTIALITY: You and/or your care partner have signed paperwork which will be entered into your electronic medical record.  These signatures attest to the fact that that the information above on your After Visit Summary has been reviewed and is understood.  Full responsibility of the  confidentiality of this discharge information lies with you and/or your care-partner.

## 2023-10-27 NOTE — Op Note (Signed)
[facility] Patient Name: Virginia Liu Procedure Date: 10/27/2023 2:40 PM MRN: [id number] Endoscopist: Meryl Dare , MD, [phone] Age: 62 Referring MD:  Date of Birth: 1961/02/10 Gender: Female Account #: [id number] Procedure:                Colonoscopy Indications:              Surveillance: Personal history of adenomatous and                            sessile serrated polyps on last colonoscopy 3 years                            ago, Family history of colon cancer, 1st-degree                            relative Medicines:                Monitored Anesthesia Care Procedure:                Pre-Anesthesia Assessment:                           - Prior to the procedure, a History and Physical                            was performed, and patient medications and                            allergies were reviewed. The patient's tolerance of                            previous anesthesia was also reviewed. The risks                            and benefits of the procedure and the sedation                            options and risks were discussed with the patient.                            All questions were answered, and informed consent                            was obtained. Prior Anticoagulants: The patient has                            taken no anticoagulant or antiplatelet agents. ASA                            Grade Assessment: II - A patient with mild systemic                            disease. After reviewing the risks and benefits,  the patient was deemed in satisfactory condition to                            undergo the procedure.                           After obtaining informed consent, the colonoscope                            was passed under direct vision. Throughout the                            procedure, the patient's blood pressure, pulse, and                            oxygen saturations were monitored continuously.  The                            Olympus Scope [id number] was introduced through the                            anus and advanced to the the cecum, identified by                            appendiceal orifice and ileocecal valve. The                            ileocecal valve, appendiceal orifice, and rectum                            were photographed. The quality of the bowel                            preparation was good. The colonoscopy was performed                            without difficulty. The patient tolerated the                            procedure well. Scope In: 2:45:19 PM Scope Out: 3:04:25 PM Scope Withdrawal Time: 0 hours 15 minutes 20 seconds  Total Procedure Duration: 0 hours 19 minutes 6 seconds  Findings:                 The perianal and digital rectal examinations showed                            external tages, otherwise were normal.                           Two sessile polyps were found in the rectum and                            sigmoid colon. The polyps were 5 to 6 mm in size.  These polyps were removed with a cold snare.                            Resection and retrieval were complete.                           Scattered small-mouthed diverticula were found in                            the right colon. There was no evidence of                            diverticular bleeding.                           Multiple medium-mouthed and small-mouthed                            diverticula were found in the left colon. There was                            no evidence of diverticular bleeding.                           Internal hemorrhoids were found during                            retroflexion. The hemorrhoids were moderate and                            Grade II (internal hemorrhoids that prolapse but                            reduce spontaneously).                           The exam was otherwise without abnormality on                             direct and retroflexion views. Complications:            No immediate complications. Estimated blood loss:                            None. Estimated Blood Loss:     Estimated blood loss: none. Impression:               - Two 5 to 6 mm polyps in the rectum and in the                            sigmoid colon, removed with a cold snare. Resected                            and retrieved.                           -  Mild diverticulosis in the right colon.                           - Moderate diverticulosis in the left colon.                           - Internal hemorrhoids, Grade II, moderate.                            External tags.                           - The examination was otherwise normal on direct                            and retroflexion views. Recommendation:           - Repeat colonoscopy, likely 5 years, after studies                            are complete for surveillance based on pathology                            results.                           - Patient has a contact number available for                            emergencies. The signs and symptoms of potential                            delayed complications were discussed with the                            patient. Return to normal activities tomorrow.                            Written discharge instructions were provided to the                            patient.                           - Resume previous diet adding high fiber.                           - Continue present medications.                           - Await pathology results.                           - Schedule hemorrhoid banding. Meryl Dare, MD 10/27/2023 3:22:06 PM This report has been signed electronically.

## 2023-10-27 NOTE — Progress Notes (Signed)
Pt's states no medical or surgical changes since previsit or office visit. 

## 2023-10-28 ENCOUNTER — Telehealth: Payer: Self-pay

## 2023-10-28 NOTE — Telephone Encounter (Signed)
  Follow up Call-     10/27/2023    1:46 PM  Call back number  Post procedure Call Back phone  # [phone]  Permission to leave phone message Yes     Left message

## 2023-11-02 ENCOUNTER — Telehealth: Payer: Self-pay

## 2023-11-02 NOTE — Telephone Encounter (Signed)
-----   Message from [place] T. Russella Dar sent at 10/27/2023  3:46 PM EST ----- Please schedule a hemorrhoid banding with JMP, VC or SA. Let me know who she is seeing so I can note that in her colonoscopy recall.

## 2023-11-02 NOTE — Progress Notes (Unsigned)
Cardiology Office Note   Date:  11/03/2023   ID:  Virginia Liu, DOB [birth year], MRN [id number]  PCP:  Marcelle Overlie, MD  Cardiologist:   Dietrich Pates, MD   Pt returns for follow up Of CAD    History of Present Illness: Virginia Liu is a 62 y.o. female with a history of  CAD on CT scan, dizziness, palpitations, GERD  and FHx of CAD   I saw the pt in March 2023  Calcium score CT done   Score 43 (78th percentile)  SInce seen the pt has done well  Really working on diet    Exercising more   Denies CP  Breathing is OK  No dizziness No palpitations   Current Meds  Medication Sig   Azelaic Acid (FINACEA EX) Apply topically as needed. For rosecea   cetirizine (ZYRTEC) 10 MG tablet Take 10 mg by mouth daily.   cholecalciferol (VITAMIN D) 1000 UNITS tablet Take 1,000 Units by mouth daily.     [employer] Fiber Gummy Bears Children CHEW Chew by mouth.   esomeprazole (NEXIUM) 20 MG capsule daily.   Estradiol-Norethindrone Acet 0.5-0.1 MG tablet Take 1 tablet by mouth daily.   famotidine (PEPCID) 40 MG tablet Take 1 tablet (40 mg total) by mouth 2 (two) times daily.   metroNIDAZOLE (METROCREAM) 0.75 % cream as needed.   Multiple Vitamins-Minerals (CENTRUM SILVER) CHEW Chew 1 tablet by mouth as needed.    omeprazole (PRILOSEC) 20 MG capsule Take 20-40 mg by mouth as needed.   rosuvastatin (CRESTOR) 10 MG tablet Take 1 tablet (10 mg total) by mouth daily.   tretinoin (RETIN-A) 0.025 % cream Apply topically as needed. Pt uses for hemorrhoids     Allergies:   Benzonatate, Molds & smuts, Pollen extract, Byrd Hesselbach, and Latex   Past Medical History:  Diagnosis Date   ABDOMINAL PAIN-EPIGASTRIC 01/15/2010   Qualifier: Diagnosis of  By: Russella Dar MD Bronson Curb T    ABDOMINAL PAIN-RUQ 05/01/2008   Qualifier: Diagnosis of  By: Russella Dar MD Bronson Curb T    Adenomatous colon polyp 12/2005   ADENOMATOUS COLONIC POLYP 12/17/2005   Qualifier: Diagnosis of  By: Misty Stanley CMA (AAMA), Amanda      Allergy    Ankle fracture    Dry eye    restasis use    Ear pressure, right 03/03/2017   GERD 05/01/2008   Qualifier: Diagnosis of  By: Misty Stanley CMA (AAMA), Marchelle Folks     GERD (gastroesophageal reflux disease)    LPR   Hemorrhoids    Osteopenia    Plantar fasciitis 12/24/2011   Left shows a small tear at insertion and very thick  RT shows old changes w large spur    Syncope    seems to vagal with sight of blood    Past Surgical History:  Procedure Laterality Date   COLONOSCOPY     DENTAL EXAMINATION UNDER ANESTHESIA     POLYPECTOMY     TUBAL LIGATION     UPPER GASTROINTESTINAL ENDOSCOPY       Social History:  The patient  reports that she has never smoked. She has never used smokeless tobacco. She reports current alcohol use of about 1.0 standard drink of alcohol per week. She reports that she does not use drugs.   Family History:  The patient's family history includes Alzheimer's disease in her mother; Colon cancer (age of onset: 46) in her father; Colon polyps in her brother; Heart failure in her mother; Hypertension in  her mother.    ROS:  Please see the history of present illness. All other systems are reviewed and  Negative to the above problem except as noted.    PHYSICAL EXAM: VS:  BP 124/76   Pulse 63   Ht 5' 7.5" (1.715 m)   Wt 125 lb 6.4 oz (56.9 kg)   SpO2 97%   BMI 19.35 kg/m   GEN: Well nourished, well developed, in no acute distress  HEENT: normal  Neck: no JVD, no carotid bruits Cardiac: RRR; no murmurs, No LE  edema  Respiratory:  clear to auscultation  GI: soft, nontender,   No hepatomegaly    EKG:  EKG is ordered today.NSR 63 bpm     Lipid Panel No results found for: "CHOL", "TRIG", "HDL", "CHOLHDL", "VLDL", "LDLCALC", "LDLDIRECT"    Wt Readings from Last 3 Encounters:  11/03/23 125 lb 6.4 oz (56.9 kg)  10/27/23 122 lb (55.3 kg)  08/11/23 122 lb (55.3 kg)      ASSESSMENT AND PLAN:  1   CAD   Ca score was 48 (75th percentile)   On  Crestor with excellent response     Will back down to 5    Check lipomed in 4 monthsw  Will try to get A1C from Dr Vincente Poli  if not done, will get that and Vit D in 4 months   2  Hx palpitatons   Denies     3  GI   REflux   Take nexium for now   Look at diet   Gave her resources  Follow up in 1 year   Current medicines are reviewed at length with the patient today.  The patient does not have concerns regarding medicines.  Signed, Dietrich Pates, MD  11/03/2023 8:16 AM    [facility] [address] [place], [place], [place]  [address] Phone: [phone]; Fax: [phone]

## 2023-11-02 NOTE — Telephone Encounter (Signed)
Left message for patient to return my call.

## 2023-11-02 NOTE — Telephone Encounter (Signed)
Offered patient her first hemorrhoid banding with Dr. Russella Dar for tomorrow 12/3 at 3:20pm. Patient agreed. Then informed patient that the next 2 bandings may be when she comes back from vacation.

## 2023-11-02 NOTE — Telephone Encounter (Signed)
Offered patient an appointment to see Dr. Barron Alvine on 12/15/22 for her banding. Patient states she will be out of town for 3 months and would like it scheduled if possible for December. Informed patient we do not have any availability for December but will put her on a cancellation list. Patient verbalized understanding. Informed patient I will make Dr. Russella Dar aware.

## 2023-11-03 ENCOUNTER — Ambulatory Visit: Payer: BC Managed Care – PPO | Attending: Internal Medicine | Admitting: Internal Medicine

## 2023-11-03 ENCOUNTER — Encounter: Payer: Self-pay | Admitting: Gastroenterology

## 2023-11-03 ENCOUNTER — Ambulatory Visit (INDEPENDENT_AMBULATORY_CARE_PROVIDER_SITE_OTHER): Payer: BC Managed Care – PPO | Admitting: Gastroenterology

## 2023-11-03 ENCOUNTER — Encounter: Payer: Self-pay | Admitting: Internal Medicine

## 2023-11-03 ENCOUNTER — Encounter: Payer: BC Managed Care – PPO | Admitting: Gastroenterology

## 2023-11-03 VITALS — BP 124/76 | HR 63 | Ht 67.5 in | Wt 125.4 lb

## 2023-11-03 VITALS — BP 122/80 | HR 76 | Ht 67.0 in | Wt 124.0 lb

## 2023-11-03 DIAGNOSIS — R0789 Other chest pain: Secondary | ICD-10-CM | POA: Diagnosis not present

## 2023-11-03 DIAGNOSIS — E782 Mixed hyperlipidemia: Secondary | ICD-10-CM | POA: Diagnosis not present

## 2023-11-03 DIAGNOSIS — K641 Second degree hemorrhoids: Secondary | ICD-10-CM | POA: Diagnosis not present

## 2023-11-03 DIAGNOSIS — Z79899 Other long term (current) drug therapy: Secondary | ICD-10-CM | POA: Diagnosis not present

## 2023-11-03 LAB — SURGICAL PATHOLOGY

## 2023-11-03 MED ORDER — ROSUVASTATIN CALCIUM 5 MG PO TABS: 5.0000 mg | ORAL_TABLET | Freq: Every day | ORAL | 3 refills | Status: DC

## 2023-11-03 NOTE — Progress Notes (Signed)
PROCEDURE NOTE: The patient presents with symptomatic grade II hemorrhoids, requesting rubber band ligation of their hemorrhoidal disease.  All risks, benefits and alternative forms of therapy were described and informed consent was obtained. Latex allergy so latex free bands were used.   The anorectum was pre-medicated during digital exam with 0.125% NTG and 5% lidocaine. In the Left Lateral Decubitus position anoscopic examination revealed grade II hemorrhoids in the  RA > RP > LL positions.   The decision was made to band the RA hemorrhoid however after 2 attempts the bands did not seat so the decision was made to band the RP internal hemorrhoid, and the [facility] O'Regan System was used to perform band ligation without complication.  Digital anorectal examination was then performed to assure proper positioning of the band and to adjust the banded tissue as required. No adjustment was needed.  No complications were encountered and the patient tolerated the procedure well.  Dietary and behavioral recommendations were given and along with follow-up instructions.   Return for possible additional hemorrhoid banding in 2 - 3 weeks as required.  Tylenol ES 1-2 po q6h prn pain and/or Advil 400 mg po q6h prn pain High fiber diet and at least 8 glasses of water qd. If not sufficient then add Benefiber daily. The patient was discharged home without pain or other post procedure problems.

## 2023-11-03 NOTE — Patient Instructions (Signed)
Medication Instructions:  Decrease Crestor to 5 mg a day  *If you need a refill on your cardiac medications before your next appointment, please call your pharmacy*   Lab Work:  If you have labs (blood work) drawn today and your tests are completely normal, you will receive your results only by: MyChart Message (if you have MyChart) OR A paper copy in the mail If you have any lab test that is abnormal or we need to change your treatment, we will call you to review the results.   Testing/Procedures: NMR in 4 months    Follow-Up: At [facility], you and your health needs are our priority.  As part of our continuing mission to provide you with exceptional heart care, we have created designated Provider Care Teams.  These Care Teams include your primary Cardiologist (physician) and Advanced Practice Providers (APPs -  Physician Assistants and Nurse Practitioners) who all work together to provide you with the care you need, when you need it.  We recommend signing up for the patient portal called "MyChart".  Sign up information is provided on this After Visit Summary.  MyChart is used to connect with patients for Virtual Visits (Telemedicine).  Patients are able to view lab/test results, encounter notes, upcoming appointments, etc.  Non-urgent messages can be sent to your provider as well.   To learn more about what you can do with MyChart, go to [email or web address].

## 2023-11-03 NOTE — Patient Instructions (Signed)
_______________________________________________________  If your blood pressure at your visit was 140/90 or greater, please contact your primary care physician to follow up on this.  _______________________________________________________  If you are age 63 or older, your body mass index should be between 23-30. Your Body mass index is 19.42 kg/m. If this is out of the aforementioned range listed, please consider follow up with your Primary Care Provider.  If you are age 23 or younger, your body mass index should be between 19-25. Your Body mass index is 19.42 kg/m. If this is out of the aformentioned range listed, please consider follow up with your Primary Care Provider.   ________________________________________________________  The  GI providers would like to encourage you to use [facility] to communicate with providers for non-urgent requests or questions.  Due to long hold times on the telephone, sending your provider a message by [facility] may be a faster and more efficient way to get a response.  Please allow 48 business hours for a response.  Please remember that this is for non-urgent requests.  _______________________________________________________  [place] PROCEDURE    FOLLOW-UP CARE   The procedure you have had should have been relatively painless since the banding of the area involved does not have nerve endings and there is no pain sensation.  The rubber band cuts off the blood supply to the hemorrhoid and the band may fall off as soon as 48 hours after the banding (the band may occasionally be seen in the toilet bowl following a bowel movement). You may notice a temporary feeling of fullness in the rectum which should respond adequately to plain Tylenol or Motrin.  Following the banding, avoid strenuous exercise that evening and resume full activity the next day.  A sitz bath (soaking in a warm tub) or bidet is soothing, and can be useful for cleansing the area  after bowel movements.     To avoid constipation, take two tablespoons of natural wheat bran, natural oat bran, flax, Benefiber or any over the counter fiber supplement and increase your water intake to 7-8 glasses daily.    Unless you have been prescribed anorectal medication, do not put anything inside your rectum for two weeks: No suppositories, enemas, fingers, etc.  Occasionally, you may have more bleeding than usual after the banding procedure.  This is often from the untreated hemorrhoids rather than the treated one.  Don't be concerned if there is a tablespoon or so of blood.  If there is more blood than this, lie flat with your bottom higher than your head and apply an ice pack to the area. If the bleeding does not stop within a half an hour or if you feel faint, call our office at [phone] or go to the emergency room.  Problems are not common; however, if there is a substantial amount of bleeding, severe pain, chills, fever or difficulty passing urine (very rare) or other problems, you should call us at [phone] or report to the nearest emergency room.  Do not stay seated continuously for more than 2-3 hours for a day or two after the procedure.  Tighten your buttock muscles 10-15 times every two hours and take 10-15 deep breaths every 1-2 hours.  Do not spend more than a few minutes on the toilet if you cannot empty your bowel; instead re-visit the toilet at a later time.   You are scheduled to follow up 11-26-23 at 930 am.  Thank you for entrusting me with your care and  choosing [facility].  Dr Russella Dar

## 2023-11-04 ENCOUNTER — Telehealth: Payer: Self-pay | Admitting: Gastroenterology

## 2023-11-04 ENCOUNTER — Encounter: Payer: Self-pay | Admitting: Gastroenterology

## 2023-11-04 DIAGNOSIS — Z1231 Encounter for screening mammogram for malignant neoplasm of breast: Secondary | ICD-10-CM | POA: Diagnosis not present

## 2023-11-04 NOTE — Telephone Encounter (Signed)
PT had a hem band done yesterday and it has fell off in the toilet. She also wants to know will she be able to travel tomorrow with or without the band being put on. Please advise.

## 2023-11-04 NOTE — Telephone Encounter (Signed)
See banding report. The first 2 bands attempted did not attach and they were left in her so she may see those 2 in her stool soon.  The 3rd band was well attached.  No need to do anything at this time.

## 2023-11-04 NOTE — Telephone Encounter (Signed)
The pt has been advised and thanked me for letting her know.

## 2023-11-04 NOTE — Telephone Encounter (Signed)
Left message on machine to call back  

## 2023-11-04 NOTE — Telephone Encounter (Signed)
The pt states that the hemorrhoid band fell of this morning.  She would like to know should she come back in to have another banding completed. She is traveling a few hours today also and wanted to be sure that was ok. She has no pain or bleeding. She will call our office if this changes.

## 2023-11-05 DIAGNOSIS — L570 Actinic keratosis: Secondary | ICD-10-CM | POA: Diagnosis not present

## 2023-11-12 DIAGNOSIS — E559 Vitamin D deficiency, unspecified: Secondary | ICD-10-CM | POA: Diagnosis not present

## 2023-11-12 DIAGNOSIS — Z0000 Encounter for general adult medical examination without abnormal findings: Secondary | ICD-10-CM | POA: Diagnosis not present

## 2023-11-12 DIAGNOSIS — E7800 Pure hypercholesterolemia, unspecified: Secondary | ICD-10-CM | POA: Diagnosis not present

## 2023-11-12 DIAGNOSIS — Z131 Encounter for screening for diabetes mellitus: Secondary | ICD-10-CM | POA: Diagnosis not present

## 2023-11-12 DIAGNOSIS — Z23 Encounter for immunization: Secondary | ICD-10-CM | POA: Diagnosis not present

## 2023-11-12 DIAGNOSIS — R590 Localized enlarged lymph nodes: Secondary | ICD-10-CM | POA: Diagnosis not present

## 2023-11-12 DIAGNOSIS — R634 Abnormal weight loss: Secondary | ICD-10-CM | POA: Diagnosis not present

## 2023-11-12 LAB — LAB REPORT - SCANNED
A1c: 5.3
EGFR: 92

## 2023-11-17 DIAGNOSIS — Z713 Dietary counseling and surveillance: Secondary | ICD-10-CM | POA: Diagnosis not present

## 2023-11-17 DIAGNOSIS — E559 Vitamin D deficiency, unspecified: Secondary | ICD-10-CM | POA: Diagnosis not present

## 2023-11-17 DIAGNOSIS — K219 Gastro-esophageal reflux disease without esophagitis: Secondary | ICD-10-CM | POA: Diagnosis not present

## 2023-11-17 DIAGNOSIS — E7800 Pure hypercholesterolemia, unspecified: Secondary | ICD-10-CM | POA: Diagnosis not present

## 2023-11-18 ENCOUNTER — Telehealth: Payer: Self-pay | Admitting: Internal Medicine

## 2023-11-18 NOTE — Telephone Encounter (Signed)
Patient is calling stating she had the labs Dr. Tenny Craw requested performed and Deboraha Sprang will be faxing them over for her to review. She is wanting to make sure someone from the office follows up with her regarding it after being reviewed by Dr. Tenny Craw.   Advised patient I do not see the labs in her chart at this time. Patient verbalized understanding and stated she will follow up with [facility] about sending them over.   Please advise.

## 2023-11-19 ENCOUNTER — Telehealth: Payer: Self-pay | Admitting: Internal Medicine

## 2023-11-19 DIAGNOSIS — Z79899 Other long term (current) drug therapy: Secondary | ICD-10-CM

## 2023-11-19 DIAGNOSIS — E782 Mixed hyperlipidemia: Secondary | ICD-10-CM

## 2023-11-19 NOTE — Telephone Encounter (Signed)
Spoke to patient   REviewed outside labs    She is going to go down on CRestor to 5 mg    Follow up lipomed and Apo B in 3 months     Needs to be set up for this

## 2023-11-20 NOTE — Addendum Note (Signed)
Addended by: Bertram Millard on: 11/20/2023 03:47 PM   Modules accepted: Orders

## 2023-11-20 NOTE — Telephone Encounter (Signed)
Orders placed and My Chart sent to the pt with lab information.

## 2023-11-26 ENCOUNTER — Ambulatory Visit: Payer: BC Managed Care – PPO | Admitting: Gastroenterology

## 2023-11-26 ENCOUNTER — Encounter: Payer: Self-pay | Admitting: Gastroenterology

## 2023-11-26 VITALS — BP 104/70 | HR 102 | Ht 67.0 in | Wt 124.0 lb

## 2023-11-26 DIAGNOSIS — K641 Second degree hemorrhoids: Secondary | ICD-10-CM | POA: Diagnosis not present

## 2023-11-26 NOTE — Progress Notes (Signed)
PROCEDURE NOTE: The patient presents with symptomatic grade II hemorrhoids, requesting rubber band ligation of their hemorrhoidal disease.  All risks, benefits and alternative forms of therapy were described and informed consent was obtained.  Latex allergy so latex free bands were used.  RP hemorrhoids banded on 12/3.  The anorectum was pre-medicated during digital exam with 0.125% NTG and 5% lidocaine.  The decision was made to band the LL and the RA internal hemorrhoids, and the [facility] O'Regan System was used to perform band ligation without complication. The first band on the RA did not seat however the second did seat.   Digital anorectal examination was then performed to assure proper positioning of the bands and to adjust the banded tissue as required. No adjustments were needed.  No complications were encountered and the patient tolerated the procedure well.  Dietary and behavioral recommendations were given and along with follow-up instructions.   Tylenol ES 1-2 po q6h prn pain and/or Advil 400 mg po q6h prn pain High fiber diet and at least 8 glasses of water qd. If not sufficient then add Benefiber daily. The patient was discharged home without pain or other post procedure problems.    Follow up with Dr. Doy Hutching.

## 2023-11-26 NOTE — Patient Instructions (Signed)
HEMORRHOID BANDING PROCEDURE    FOLLOW-UP CARE   The procedure you have had should have been relatively painless since the banding of the area involved does not have nerve endings and there is no pain sensation.  The rubber band cuts off the blood supply to the hemorrhoid and the band may fall off as soon as 48 hours after the banding (the band may occasionally be seen in the toilet bowl following a bowel movement). You may notice a temporary feeling of fullness in the rectum which should respond adequately to plain Tylenol or Motrin.  Following the banding, avoid strenuous exercise that evening and resume full activity the next day.  A sitz bath (soaking in a warm tub) or bidet is soothing, and can be useful for cleansing the area after bowel movements.     To avoid constipation, take two tablespoons of natural wheat bran, natural oat bran, flax, Benefiber or any over the counter fiber supplement and increase your water intake to 7-8 glasses daily.    Unless you have been prescribed anorectal medication, do not put anything inside your rectum for two weeks: No suppositories, enemas, fingers, etc.  Occasionally, you may have more bleeding than usual after the banding procedure.  This is often from the untreated hemorrhoids rather than the treated one.  Don't be concerned if there is a tablespoon or so of blood.  If there is more blood than this, lie flat with your bottom higher than your head and apply an ice pack to the area. If the bleeding does not stop within a half an hour or if you feel faint, call our office at [phone] or go to the emergency room.  Problems are not common; however, if there is a substantial amount of bleeding, severe pain, chills, fever or difficulty passing urine (very rare) or other problems, you should call us at [phone] or report to the nearest emergency room.  Do not stay seated continuously for more than 2-3 hours for a day or two after the procedure.   Tighten your buttock muscles 10-15 times every two hours and take 10-15 deep breaths every 1-2 hours.  Do not spend more than a few minutes on the toilet if you cannot empty your bowel; instead re-visit the toilet at a later time.    

## 2023-11-27 ENCOUNTER — Telehealth: Payer: Self-pay | Admitting: Gastroenterology

## 2023-11-27 NOTE — Telephone Encounter (Signed)
Inbound call from patient states she has been vomiting. Please advise.

## 2023-12-03 NOTE — Telephone Encounter (Signed)
 Message left for the pt to return call if she is still having symptoms

## 2023-12-16 ENCOUNTER — Encounter: Payer: BC Managed Care – PPO | Admitting: Gastroenterology

## 2024-03-14 DIAGNOSIS — E782 Mixed hyperlipidemia: Secondary | ICD-10-CM | POA: Diagnosis not present

## 2024-03-14 DIAGNOSIS — Z79899 Other long term (current) drug therapy: Secondary | ICD-10-CM | POA: Diagnosis not present

## 2024-03-15 ENCOUNTER — Telehealth: Payer: Self-pay | Admitting: Internal Medicine

## 2024-03-15 LAB — NMR, LIPOPROFILE
Cholesterol, Total: 163 mg/dL (ref 100–199)
HDL Particle Number: 41.5 umol/L (ref >=30.5)
HDL-C: 75 mg/dL (ref >39)
LDL Particle Number: 756 nmol/L (ref <1000)
LDL Size: 20.6 nm (ref >20.5)
LDL-C (NIH Calc): 77 mg/dL (ref 0–99)
LP-IR Score: 26 (ref <=45)
Small LDL Particle Number: 185 nmol/L (ref <=527)
Triglycerides: 51 mg/dL (ref 0–149)

## 2024-03-15 LAB — APOLIPOPROTEIN B: Apolipoprotein B: 64 mg/dL (ref <90)

## 2024-03-15 NOTE — Telephone Encounter (Signed)
 Spoke with pt regarding her LDL. Pt was concerned because she read her results on MyChart and her LDL came back low. Pt was assured that a low LDL is a good thing and that Dr. Avanell Bob has not resulted the labs yet so what she is looking at is meant for the doctor to interpret. Pt plans to wait to hear from Dr. Avanell Bob or nurse about her results. Pt verbalized understanding. All questions if any were answered.

## 2024-03-15 NOTE — Telephone Encounter (Signed)
 Patient reviewed lab results and noticed low LDL. She would like a call back to discuss. She is aware that the results haven't been read yet and says she doesn't want to rush Dr. Avanell Bob. She's just a little concerned and inquiring about the urgency of low LDL. Please advise.

## 2024-03-22 ENCOUNTER — Telehealth: Payer: Self-pay

## 2024-03-22 MED ORDER — ROSUVASTATIN CALCIUM 10 MG PO TABS: 5.0000 mg | ORAL_TABLET | Freq: Every day | ORAL | Status: DC

## 2024-03-22 NOTE — Telephone Encounter (Signed)
-----   Message from Frederick sent at 03/17/2024 11:31 AM EDT ----- Left a VM for patient  OVerall numbers are still good  They were better on th 10 mg       If tolerated 10 mg can resume  Keep up with healthy eating, staying active    I gave my cell number so that pt could reach me with questions

## 2024-03-22 NOTE — Telephone Encounter (Signed)
 Pt advised her lab results and says she will try taking the 10 mg Rosuvastatin  and she will let us  know how she does.   Pt says she has lost some weight recently... she does not know how much but she had not been trying and she has been feeling sluggish.   She is out of town at this time so she does not have her calendar but when she gets home she plans to see when she is seeing her OBGYN again...but she says it should be soon to see if they would be willing to order some additional labwork for her while she is there.   According to her, she will call us  back if she needs us .

## 2024-03-28 DIAGNOSIS — L570 Actinic keratosis: Secondary | ICD-10-CM | POA: Diagnosis not present

## 2024-03-28 DIAGNOSIS — L814 Other melanin hyperpigmentation: Secondary | ICD-10-CM | POA: Diagnosis not present

## 2024-03-28 DIAGNOSIS — L821 Other seborrheic keratosis: Secondary | ICD-10-CM | POA: Diagnosis not present

## 2024-03-28 DIAGNOSIS — L820 Inflamed seborrheic keratosis: Secondary | ICD-10-CM | POA: Diagnosis not present

## 2024-03-28 DIAGNOSIS — D1801 Hemangioma of skin and subcutaneous tissue: Secondary | ICD-10-CM | POA: Diagnosis not present

## 2024-04-05 DIAGNOSIS — F430 Acute stress reaction: Secondary | ICD-10-CM | POA: Diagnosis not present

## 2024-04-05 DIAGNOSIS — K219 Gastro-esophageal reflux disease without esophagitis: Secondary | ICD-10-CM | POA: Diagnosis not present

## 2024-06-17 DIAGNOSIS — H5213 Myopia, bilateral: Secondary | ICD-10-CM | POA: Diagnosis not present

## 2024-06-17 DIAGNOSIS — H01005 Unspecified blepharitis left lower eyelid: Secondary | ICD-10-CM | POA: Diagnosis not present

## 2024-06-17 DIAGNOSIS — H2513 Age-related nuclear cataract, bilateral: Secondary | ICD-10-CM | POA: Diagnosis not present

## 2024-06-17 DIAGNOSIS — H01002 Unspecified blepharitis right lower eyelid: Secondary | ICD-10-CM | POA: Diagnosis not present

## 2024-06-20 ENCOUNTER — Other Ambulatory Visit: Payer: Self-pay | Admitting: Internal Medicine

## 2024-06-21 ENCOUNTER — Telehealth: Payer: Self-pay | Admitting: Internal Medicine

## 2024-06-21 MED ORDER — ROSUVASTATIN CALCIUM 10 MG PO TABS
5.0000 mg | ORAL_TABLET | Freq: Every day | ORAL | 1 refills | Status: DC
Start: 2024-06-21 — End: 2024-08-17

## 2024-06-21 NOTE — Telephone Encounter (Signed)
*  STAT* If patient is at the pharmacy, call can be transferred to refill team.   1. Which medications need to be refilled? (please list name of each medication and dose if known) rosuvastatin  (CRESTOR ) 10 MG tablet   2. Which pharmacy/location (including street and city if local pharmacy) is medication to be sent to?  [facility] - [place], [place] - [address]    3. Do they need a 30 day or 90 day supply? 90

## 2024-06-21 NOTE — Telephone Encounter (Signed)
 Pt's medication was sent to pt's pharmacy as requested. Confirmation received.

## 2024-07-07 ENCOUNTER — Ambulatory Visit
Admit: 2024-07-07 | Discharge: 2024-07-07 | Payer: BLUE CROSS/BLUE SHIELD | Attending: Family | Primary: Diagnostic Radiology

## 2024-07-07 VITALS — BP 116/78 | HR 69 | Temp 98.30000°F | Wt 121.6 lb

## 2024-07-07 DIAGNOSIS — B370 Candidal stomatitis: Principal | ICD-10-CM

## 2024-07-07 DIAGNOSIS — K140 Glossitis: Secondary | ICD-10-CM | POA: Diagnosis not present

## 2024-07-07 MED ORDER — MAGIC MOUTHWASH
Freq: Four times a day (QID) | 0 refills | 4.00000 days | Status: AC | PRN
Start: 2024-07-07 — End: ?

## 2024-07-07 NOTE — Progress Notes (Signed)
 CHIEF COMPLAINT:  Chief Complaint   Patient presents with    Other     History of ulcers in mouth. Tongue is really white and has had trouble eating for last several days.        HISTORY OF PRESENT ILLNESS:  Ms. Pais is a 63 y.o. female  who presents to the clinic with a tongue sore and thick coating on her tongue.  Patient reports for the last few days she has had a thick coating on the top of her tongue and an ulcer at the tip of her tongue, that seems to be worsening.  She has tried salt with warm water gargling, which does seem to help.  Patient reports the ulcer at the tip of her tongue is very sore.  She also reports she has been scraping her tongue to try and remove the thick layer.  No new medications in the last week except for taking Prevacid due to GERD.  She does wear a bite guard at night due to teeth grinding    CURRENT MEDICATION LIST:    Current Outpatient Medications   Medication Sig Dispense Refill    lansoprazole (PREVACID SOLUTAB) 15 MG disintegrating tablet Take 1 tablet by mouth daily as needed (Reflux)      rosuvastatin (CRESTOR) 10 MG tablet Take 1 tablet by mouth daily      Magic Mouthwash (MIRACLE MOUTHWASH) Swish and spit 5 mLs 4 times daily as needed for Irritation Diphenhydramine:nystatin:lidocaine:maalox 1:1:1:1 mixing ration 140 mL 0    Estradiol-Norethindrone Acet 0.5-0.1 MG TABS       cetirizine (ZYRTEC) 10 MG tablet       Multiple Vitamins-Minerals (CENTRUM SILVER) CHEW Take 1 tablet by mouth as needed      vitamin D (CHOLECALCIFEROL) 25 MCG (1000 UT) TABS tablet Take 1 tablet by mouth daily       No current facility-administered medications for this visit.        ALLERGIES:    Allergies   Allergen Reactions    Pollen Extract     Molds & Smuts Rash        HISTORY:  Past Medical History:   Diagnosis Date    Allergic rhinitis     Hyperlipidemia       History reviewed. No pertinent surgical history.   Social History     Socioeconomic History    Marital status: Married     Spouse  name: Not on file    Number of children: Not on file    Years of education: Not on file    Highest education level: Not on file   Occupational History    Not on file   Tobacco Use    Smoking status: Never    Smokeless tobacco: Never   Vaping Use    Vaping status: Never Used   Substance and Sexual Activity    Alcohol use: Not Currently    Drug use: Never    Sexual activity: Not on file   Other Topics Concern    Not on file   Social History Narrative    Not on file     Social Drivers of Health     Financial Resource Strain: Not on file   Food Insecurity: Low Risk  (03/11/2023)    Received from [facility]    Hunger Vital Sign     Worried About Running Out of Food in the Last Year: Never true     Ran Out of Food in the  Last Year: Never true   Transportation Needs: Not on file (03/11/2023)   Physical Activity: Not on file   Stress: Not on file   Social Connections: Not on file   Intimate Partner Violence: Not on file   Housing Stability: Low Risk  (03/11/2023)    Received from [facility]    Housing Stability Vital Sign     What is your living situation today?: I have a steady place to live     Think about the place you live. Do you have problems with any of the following? Choose all that apply:: None/None on this list      History reviewed. No pertinent family history.     REVIEW OF SYSTEMS:  Review of Systems   HENT:  Positive for mouth sores.          PHYSICAL EXAM:  Physical Exam  Vitals and nursing note reviewed.   Constitutional:       Appearance: Normal appearance.   HENT:      Head: Normocephalic.      Nose: Nose normal.      Mouth/Throat:      Mouth: Oral lesions present.      Tongue: Lesions present.      Comments: Thick white coating along anterior portion of the tongue, within large papillae towards the back.  1 cm lesion on the right tip of the tongue with edema  Lymphadenopathy:      Cervical: No cervical adenopathy.   Neurological:      Mental Status: She is alert.            Vital Signs -   Vitals:     07/07/24 1440   BP: 116/78   BP Site: Left Upper Arm   Patient Position: Sitting   BP Cuff Size: Medium Adult   Pulse: 69   Temp: 98.3 F (36.8 C)   TempSrc: Skin   SpO2: 96%   Weight: 55.2 kg (121 lb 9.6 oz)            LABS  No results found for this visit on 07/07/24.  No results found for any previous visit.       IMPRESSION/PLAN        Tanayia was seen today for other.    Diagnoses and all orders for this visit:    Oral thrush  -     Magic Mouthwash (MIRACLE MOUTHWASH); Swish and spit 5 mLs 4 times daily as needed for Irritation Diphenhydramine:nystatin:lidocaine:maalox 1:1:1:1 mixing ration    Ulceration (traumatic) of tongue  -     Magic Mouthwash (MIRACLE MOUTHWASH); Swish and spit 5 mLs 4 times daily as needed for Irritation Diphenhydramine:nystatin:lidocaine:maalox 1:1:1:1 mixing ration       Exam indicative of thrush with mechanical trauma to her tongue.  Patient suggested she might have bit it at night with her bite guard.  She can continue with soft foods as tolerated, ibuprofen, and use Magic mouthwash as directed.  Patient needs to thoroughly clean bite guard.  If no improvement, can refer to ENT or follow-up with her PCP at home.    Follow up and Dispositions:  Return if symptoms worsen or fail to improve.       Charnay Nazario A Jaree Dwight, APRN - NP

## 2024-07-08 ENCOUNTER — Ambulatory Visit
Admit: 2024-07-08 | Discharge: 2024-07-08 | Payer: BLUE CROSS/BLUE SHIELD | Attending: Family | Primary: Diagnostic Radiology

## 2024-07-08 VITALS — BP 120/80 | HR 69 | Ht 67.5 in

## 2024-07-08 DIAGNOSIS — B370 Candidal stomatitis: Principal | ICD-10-CM

## 2024-07-08 DIAGNOSIS — K140 Glossitis: Secondary | ICD-10-CM | POA: Diagnosis not present

## 2024-07-08 MED ORDER — AMOXICILLIN 500 MG PO CAPS
500 | ORAL_CAPSULE | Freq: Two times a day (BID) | ORAL | 0 refills | 10.00000 days | Status: AC
Start: 2024-07-08 — End: 2024-07-15

## 2024-07-08 NOTE — Telephone Encounter (Signed)
Pt in office today for evaluation.

## 2024-07-08 NOTE — Telephone Encounter (Addendum)
Pt in office today for evaluation.

## 2024-07-08 NOTE — Progress Notes (Signed)
 CHIEF COMPLAINT:  Chief Complaint   Patient presents with    Mouth Lesions     Cut on the tip of the tongue   Possible needs an antibiotic         HISTORY OF PRESENT ILLNESS:  Ms. Hennings is a 63 y.o. female  who presents to the clinic with ongoing concern of mouth ulcer and thrush.  She did pick up Magic mouthwash that was prescribed yesterday, and complained that it burned.  She is only using it twice.  She does not feel that her mouth is worse, just with concern that it has not improved.    CURRENT MEDICATION LIST:    Current Outpatient Medications   Medication Sig Dispense Refill    amoxicillin  (AMOXIL ) 500 MG capsule Take 1 capsule by mouth 2 times daily for 7 days 14 capsule 0    lansoprazole (PREVACID SOLUTAB) 15 MG disintegrating tablet Take 1 tablet by mouth daily as needed (Reflux)      rosuvastatin (CRESTOR) 10 MG tablet Take 1 tablet by mouth daily      Magic Mouthwash (MIRACLE MOUTHWASH) Swish and spit 5 mLs 4 times daily as needed for Irritation Diphenhydramine:nystatin:lidocaine:maalox 1:1:1:1 mixing ration 140 mL 0    Estradiol-Norethindrone Acet 0.5-0.1 MG TABS       cetirizine (ZYRTEC) 10 MG tablet       Multiple Vitamins-Minerals (CENTRUM SILVER) CHEW Take 1 tablet by mouth as needed      vitamin D (CHOLECALCIFEROL) 25 MCG (1000 UT) TABS tablet Take 1 tablet by mouth daily       No current facility-administered medications for this visit.        ALLERGIES:    Allergies   Allergen Reactions    Benzonatate  Cough    Pollen Extract     Molds & Smuts Rash        HISTORY:  Past Medical History:   Diagnosis Date    Allergic rhinitis     Hyperlipidemia       History reviewed. No pertinent surgical history.   Social History     Socioeconomic History    Marital status: Married     Spouse name: Not on file    Number of children: Not on file    Years of education: Not on file    Highest education level: Not on file   Occupational History    Not on file   Tobacco Use    Smoking status: Never    Smokeless  tobacco: Never   Vaping Use    Vaping status: Never Used   Substance and Sexual Activity    Alcohol use: Not Currently    Drug use: Never    Sexual activity: Not on file   Other Topics Concern    Not on file   Social History Narrative    Not on file     Social Drivers of Health     Financial Resource Strain: Not on file   Food Insecurity: Low Risk  (03/11/2023)    Received from [facility]    Hunger Vital Sign     Worried About Running Out of Food in the Last Year: Never true     Ran Out of Food in the Last Year: Never true   Transportation Needs: Not on file (03/11/2023)   Physical Activity: Not on file   Stress: Not on file   Social Connections: Not on file   Intimate Partner Violence: Not on file   Housing Stability:  Low Risk  (03/11/2023)    Received from [facility] Stability Vital Sign     What is your living situation today?: I have a steady place to live     Think about the place you live. Do you have problems with any of the following? Choose all that apply:: None/None on this list      History reviewed. No pertinent family history.     REVIEW OF SYSTEMS:  Review of Systems   Skin:  Positive for wound.         PHYSICAL EXAM:  Physical Exam  Vitals and nursing note reviewed.   Constitutional:       Appearance: Normal appearance.   HENT:      Mouth/Throat:      Tongue: Lesions present.      Comments: White coating still present across entire tongue, lesion at the tip with less edema-more flat appearance today subjectively, similar size as yesterday.  Lymphadenopathy:      Cervical: No cervical adenopathy.   Neurological:      Mental Status: She is alert.   Psychiatric:         Mood and Affect: Mood normal.         Behavior: Behavior normal.            Vital Signs -   Vitals:    07/08/24 0856   BP: 120/80   Pulse: 69   SpO2: 99%   Height: 1.715 m (5' 7.5)            LABS  No results found for this visit on 07/08/24.  No results found for any previous visit.       IMPRESSION/PLAN        Lilly was  seen today for mouth lesions.    Diagnoses and all orders for this visit:    Oral thrush  [facility]    Ulceration (traumatic) of tongue  [facility]  -     amoxicillin  (AMOXIL ) 500 MG capsule; Take 1 capsule by mouth 2 times daily for 7 days       Encourage patient to continue with mouthwash, tipping mouth forward as she did not like the feeling of the lidocaine in the back of her throat.  She needs to hold the medicine in her mouth for approximately 1 minute if possible, and use 4 times daily to help with thrush.  Subjectively I do feel that her ulcer at the tip of her tongue has improved over the last 24 hours.  Patient was given oral antibiotic to start only if symptoms worsen over the weekend.  [facility] referral placed, in case symptoms continue to worsen.    Follow up and Dispositions:  Return if symptoms worsen or fail to improve.       Kishaun Erekson A Shadee Montoya, APRN - NP

## 2024-07-26 DIAGNOSIS — Z01419 Encounter for gynecological examination (general) (routine) without abnormal findings: Secondary | ICD-10-CM | POA: Diagnosis not present

## 2024-07-26 DIAGNOSIS — E559 Vitamin D deficiency, unspecified: Secondary | ICD-10-CM | POA: Diagnosis not present

## 2024-07-26 DIAGNOSIS — Z13820 Encounter for screening for osteoporosis: Secondary | ICD-10-CM | POA: Diagnosis not present

## 2024-07-26 DIAGNOSIS — Z681 Body mass index (BMI) 19 or less, adult: Secondary | ICD-10-CM | POA: Diagnosis not present

## 2024-08-16 DIAGNOSIS — M2669 Other specified disorders of temporomandibular joint: Secondary | ICD-10-CM | POA: Diagnosis not present

## 2024-08-16 DIAGNOSIS — K219 Gastro-esophageal reflux disease without esophagitis: Secondary | ICD-10-CM | POA: Diagnosis not present

## 2024-08-17 ENCOUNTER — Telehealth: Payer: Self-pay | Admitting: Internal Medicine

## 2024-08-17 MED ORDER — ROSUVASTATIN CALCIUM 10 MG PO TABS: 10.0000 mg | ORAL_TABLET | Freq: Every day | ORAL | 3 refills | Status: AC

## 2024-08-17 NOTE — Telephone Encounter (Signed)
 Pt c/o medication issue:  1. Name of Medication:   rosuvastatin  (CRESTOR ) 10 MG tablet   2. How are you currently taking this medication (dosage and times per day)?   3. Are you having a reaction (difficulty breathing--STAT)?   4. What is your medication issue?   Patient stated she has been taking the whole tablet and wants to know if she can get a new prescription for this medication.  Patient wants new prescription sent to [facility] - [place], [place] - [address].  Patient also wants to know the difference between the generic and the brand.

## 2024-08-17 NOTE — Telephone Encounter (Signed)
 Per Dr. Okey from last lab result on 03/17/24: Left a VM for patient  OVerall numbers are still good  They were better on th 10 mg       If tolerated 10 mg can resume  Keep up with healthy eating, staying active    I gave my cell number so that pt could reach me with questions    Patient has been taking 10 mg of rosuvastatin  daily and wishes to continue on this dose.  Medication instructions updated per recommendation above and refill sent to pharmacy.

## 2024-08-22 DIAGNOSIS — L918 Other hypertrophic disorders of the skin: Secondary | ICD-10-CM | POA: Diagnosis not present

## 2024-10-19 DIAGNOSIS — R051 Acute cough: Secondary | ICD-10-CM | POA: Diagnosis not present

## 2024-10-19 DIAGNOSIS — J0140 Acute pansinusitis, unspecified: Secondary | ICD-10-CM | POA: Diagnosis not present

## 2024-10-19 DIAGNOSIS — J029 Acute pharyngitis, unspecified: Secondary | ICD-10-CM | POA: Diagnosis not present

## 2024-10-26 DIAGNOSIS — K219 Gastro-esophageal reflux disease without esophagitis: Secondary | ICD-10-CM | POA: Diagnosis not present

## 2024-11-04 DIAGNOSIS — K219 Gastro-esophageal reflux disease without esophagitis: Secondary | ICD-10-CM | POA: Diagnosis not present

## 2024-11-04 DIAGNOSIS — J029 Acute pharyngitis, unspecified: Secondary | ICD-10-CM | POA: Diagnosis not present

## 2024-11-08 DIAGNOSIS — K219 Gastro-esophageal reflux disease without esophagitis: Secondary | ICD-10-CM | POA: Diagnosis not present

## 2024-11-09 DIAGNOSIS — Z1231 Encounter for screening mammogram for malignant neoplasm of breast: Secondary | ICD-10-CM | POA: Diagnosis not present

## 2024-11-14 DIAGNOSIS — I2510 Atherosclerotic heart disease of native coronary artery without angina pectoris: Secondary | ICD-10-CM | POA: Diagnosis not present

## 2024-11-14 DIAGNOSIS — N951 Menopausal and female climacteric states: Secondary | ICD-10-CM | POA: Diagnosis not present

## 2024-11-14 DIAGNOSIS — F5101 Primary insomnia: Secondary | ICD-10-CM | POA: Diagnosis not present

## 2024-11-14 DIAGNOSIS — Z0000 Encounter for general adult medical examination without abnormal findings: Secondary | ICD-10-CM | POA: Diagnosis not present

## 2024-11-14 DIAGNOSIS — K648 Other hemorrhoids: Secondary | ICD-10-CM | POA: Diagnosis not present

## 2024-11-14 DIAGNOSIS — J309 Allergic rhinitis, unspecified: Secondary | ICD-10-CM | POA: Diagnosis not present

## 2024-11-14 DIAGNOSIS — E559 Vitamin D deficiency, unspecified: Secondary | ICD-10-CM | POA: Diagnosis not present

## 2024-11-14 DIAGNOSIS — Z23 Encounter for immunization: Secondary | ICD-10-CM | POA: Diagnosis not present

## 2024-11-14 DIAGNOSIS — M8588 Other specified disorders of bone density and structure, other site: Secondary | ICD-10-CM | POA: Diagnosis not present

## 2024-11-14 DIAGNOSIS — K219 Gastro-esophageal reflux disease without esophagitis: Secondary | ICD-10-CM | POA: Diagnosis not present

## 2024-11-14 DIAGNOSIS — E7800 Pure hypercholesterolemia, unspecified: Secondary | ICD-10-CM | POA: Diagnosis not present

## 2025-05-01 ENCOUNTER — Ambulatory Visit: Admitting: Internal Medicine
